# Patient Record
Sex: Female | Born: 1975 | Hispanic: Yes | Marital: Married | Smoking: Never smoker
Health system: Southern US, Community
[De-identification: ages and names within clinical notes are randomized; demographics above are authoritative.]

## PROBLEM LIST (undated history)

## (undated) DIAGNOSIS — O24419 Gestational diabetes mellitus in pregnancy, unspecified control: Secondary | ICD-10-CM

## (undated) DIAGNOSIS — F329 Major depressive disorder, single episode, unspecified: Secondary | ICD-10-CM

## (undated) DIAGNOSIS — F32A Depression, unspecified: Secondary | ICD-10-CM

## (undated) DIAGNOSIS — I8390 Asymptomatic varicose veins of unspecified lower extremity: Secondary | ICD-10-CM

## (undated) HISTORY — DX: Major depressive disorder, single episode, unspecified: F32.9

## (undated) HISTORY — DX: Gestational diabetes mellitus in pregnancy, unspecified control: O24.419

## (undated) HISTORY — DX: Depression, unspecified: F32.A

## (undated) HISTORY — DX: Asymptomatic varicose veins of unspecified lower extremity: I83.90

## (undated) HISTORY — PX: NO PAST SURGERIES: SHX2092

---

## 2001-07-06 ENCOUNTER — Ambulatory Visit (HOSPITAL_COMMUNITY): Admission: RE | Admit: 2001-07-06 | Discharge: 2001-07-06 | Payer: Self-pay | Admitting: *Deleted

## 2001-09-29 ENCOUNTER — Inpatient Hospital Stay (HOSPITAL_COMMUNITY): Admission: AD | Admit: 2001-09-29 | Discharge: 2001-10-01 | Payer: Self-pay | Admitting: *Deleted

## 2007-07-27 LAB — SICKLE CELL SCREEN: Sickle Cell Screen: NORMAL

## 2009-03-15 ENCOUNTER — Inpatient Hospital Stay (HOSPITAL_COMMUNITY): Admission: AD | Admit: 2009-03-15 | Discharge: 2009-03-15 | Payer: Self-pay | Admitting: Obstetrics and Gynecology

## 2009-03-15 ENCOUNTER — Encounter: Payer: Self-pay | Admitting: Obstetrics and Gynecology

## 2009-03-15 ENCOUNTER — Ambulatory Visit: Payer: Self-pay | Admitting: Obstetrics and Gynecology

## 2009-04-18 ENCOUNTER — Inpatient Hospital Stay (HOSPITAL_COMMUNITY): Admission: AD | Admit: 2009-04-18 | Discharge: 2009-04-18 | Payer: Self-pay | Admitting: Obstetrics & Gynecology

## 2010-04-22 LAB — OB RESULTS CONSOLE VARICELLA ZOSTER ANTIBODY, IGG: Varicella: IMMUNE

## 2010-04-24 ENCOUNTER — Ambulatory Visit (HOSPITAL_COMMUNITY): Admission: RE | Admit: 2010-04-24 | Discharge: 2010-04-24 | Payer: Self-pay | Admitting: Family Medicine

## 2010-07-28 ENCOUNTER — Ambulatory Visit (HOSPITAL_COMMUNITY): Admission: RE | Admit: 2010-07-28 | Discharge: 2010-07-28 | Payer: Self-pay | Admitting: Family Medicine

## 2010-08-25 ENCOUNTER — Ambulatory Visit (HOSPITAL_COMMUNITY): Admission: RE | Admit: 2010-08-25 | Discharge: 2010-08-25 | Payer: Self-pay | Admitting: Obstetrics & Gynecology

## 2010-08-29 ENCOUNTER — Encounter: Payer: Self-pay | Admitting: Obstetrics & Gynecology

## 2010-08-29 ENCOUNTER — Inpatient Hospital Stay (HOSPITAL_COMMUNITY)
Admission: AD | Admit: 2010-08-29 | Discharge: 2010-08-31 | Payer: Self-pay | Source: Home / Self Care | Admitting: Obstetrics & Gynecology

## 2010-08-29 ENCOUNTER — Ambulatory Visit: Payer: Self-pay | Admitting: Family Medicine

## 2011-01-20 LAB — RPR: RPR Ser Ql: NONREACTIVE

## 2011-01-20 LAB — CBC
Platelets: 208 10*3/uL (ref 150–400)
RDW: 17.1 % — ABNORMAL HIGH (ref 11.5–15.5)
WBC: 9.6 10*3/uL (ref 4.0–10.5)

## 2011-01-20 LAB — AMNISURE RUPTURE OF MEMBRANE (ROM) NOT AT ARMC: Amnisure ROM: POSITIVE

## 2011-02-15 LAB — POCT PREGNANCY, URINE: Preg Test, Ur: NEGATIVE

## 2011-02-15 LAB — GC/CHLAMYDIA PROBE AMP, GENITAL: GC Probe Amp, Genital: NEGATIVE

## 2011-02-16 LAB — CBC
HCT: 33.4 % — ABNORMAL LOW (ref 36.0–46.0)
MCV: 93.4 fL (ref 78.0–100.0)
RBC: 3.58 MIL/uL — ABNORMAL LOW (ref 3.87–5.11)
WBC: 11.9 10*3/uL — ABNORMAL HIGH (ref 4.0–10.5)

## 2012-05-24 IMAGING — US US OB FOLLOW-UP
1 series · 14 of 28 positions shown · non-contrast
Comparison: none

OBSTETRICAL ULTRASOUND:
 This ultrasound exam was performed in the [HOSPITAL] Ultrasound Department.  The OB US report was generated in the AS system, and faxed to the ordering physician.  This report is also available in [HOSPITAL]?s AccessANYware and in [REDACTED] PACS.

[Series 1: us ob follow up · 0.33mm/px · 14 of 30 slices shown]
[im 2/30]
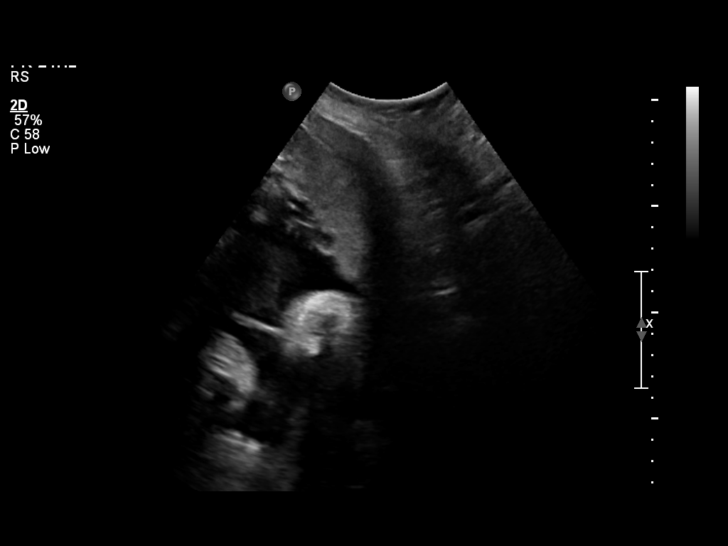
[im 4/30]
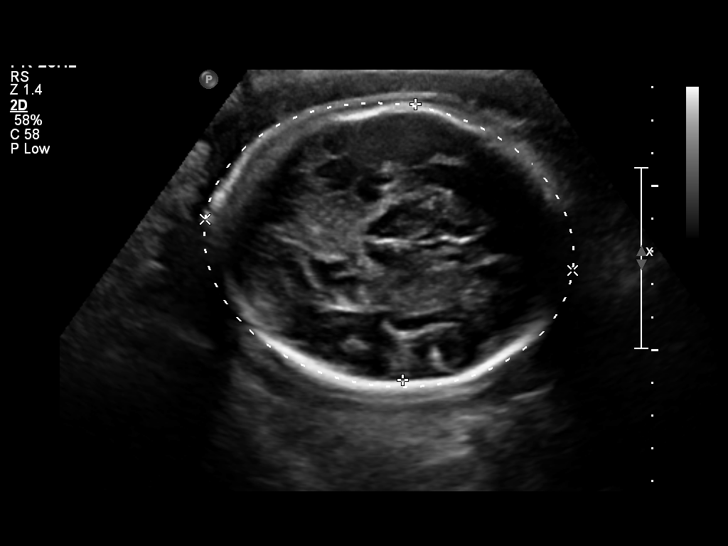
[im 6/30]
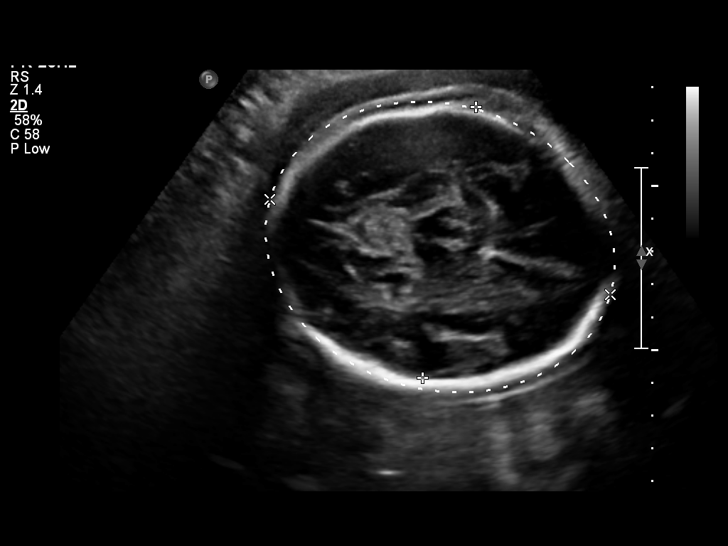
[im 8/30]
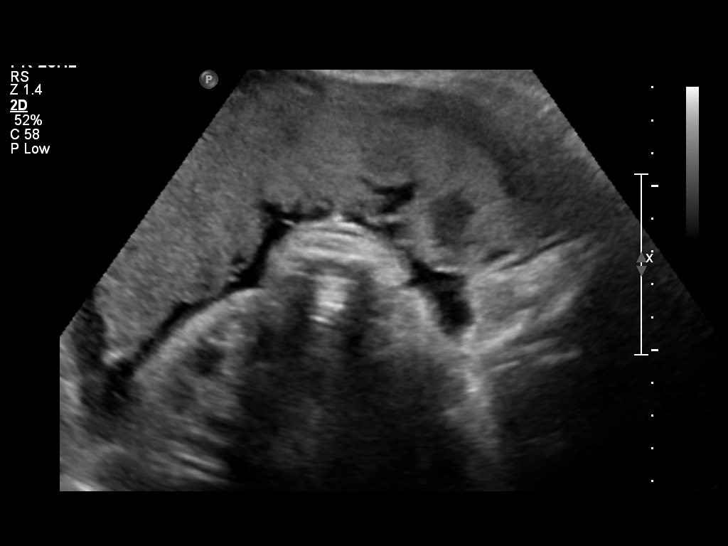
[im 10/30]
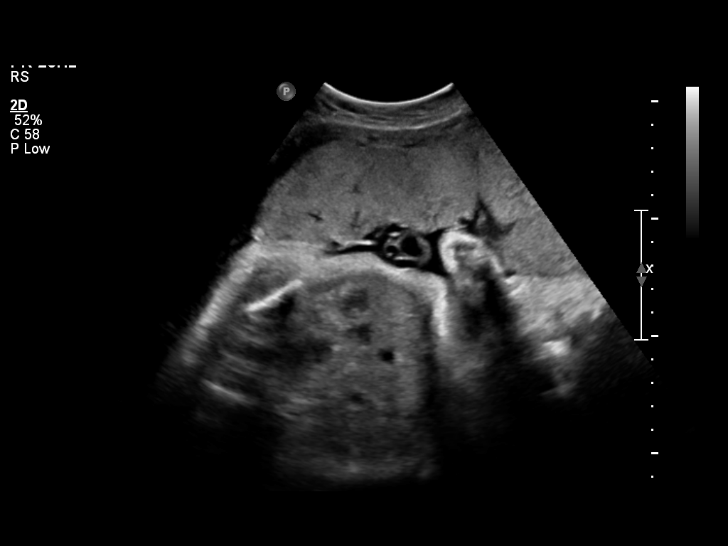
[im 12/30]
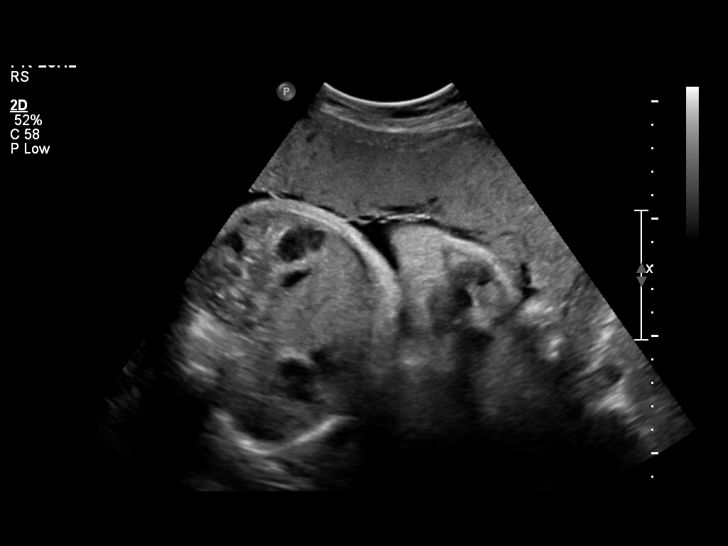
[im 14/30]
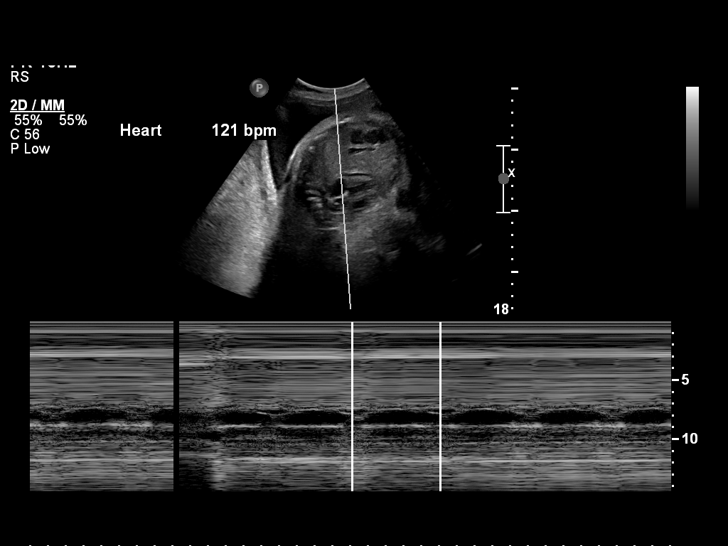
[im 17/30]
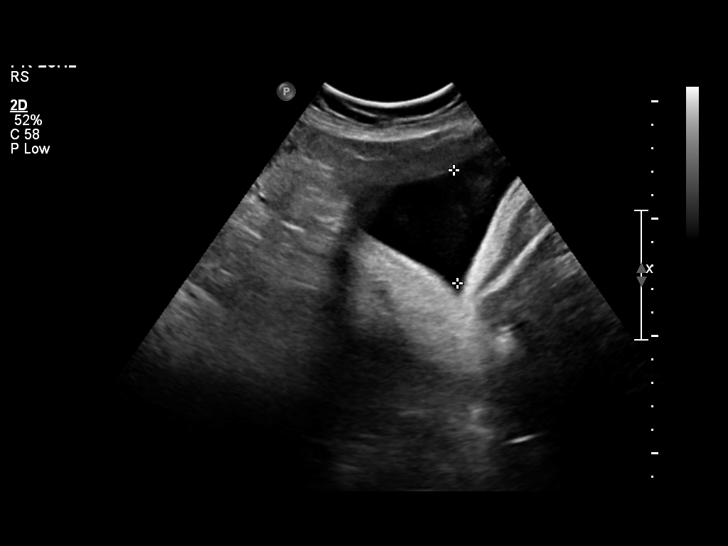
[im 19/30]
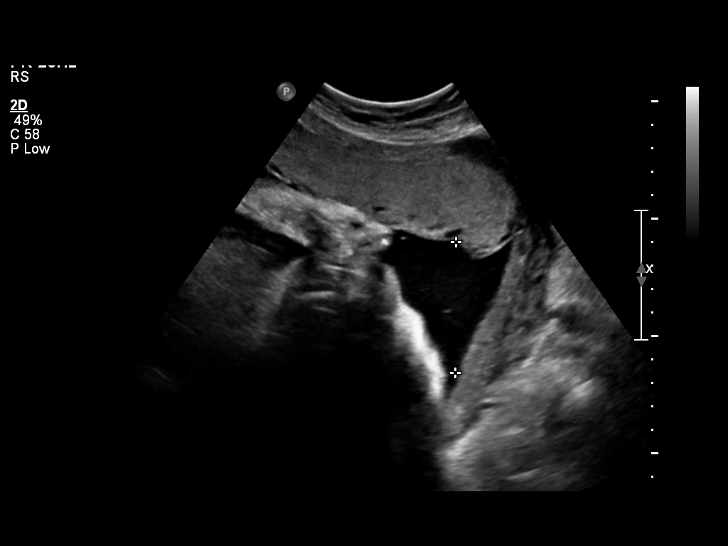
[im 21/30]
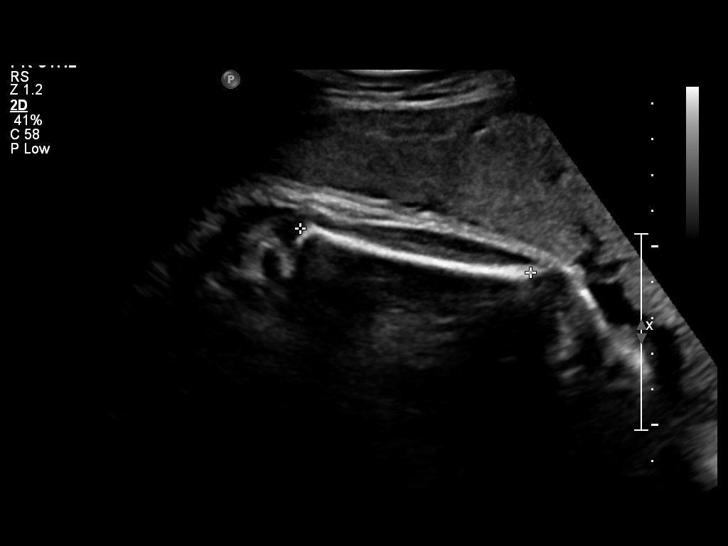
[im 23/30]
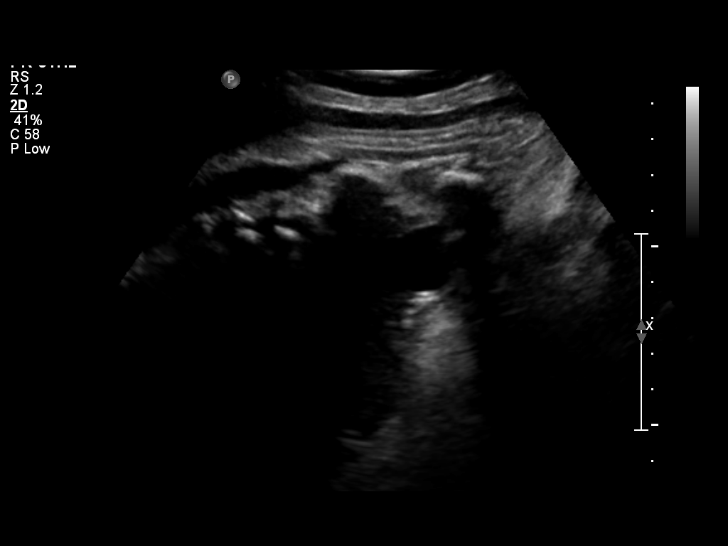
[im 25/30]
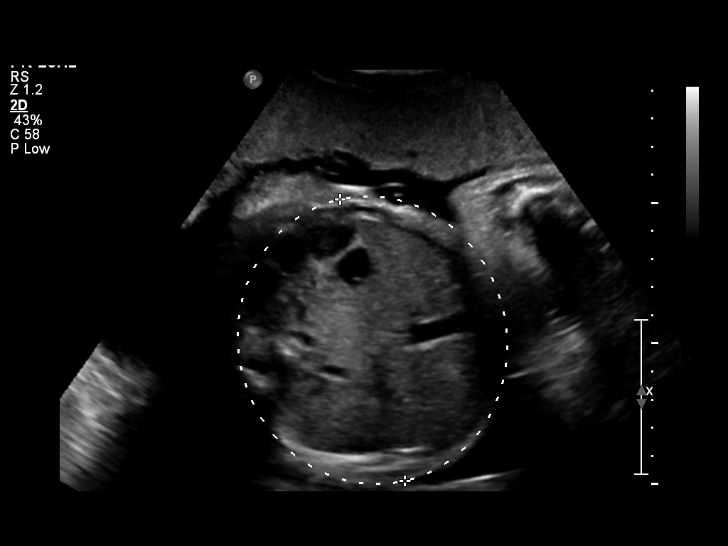
[im 27/30]
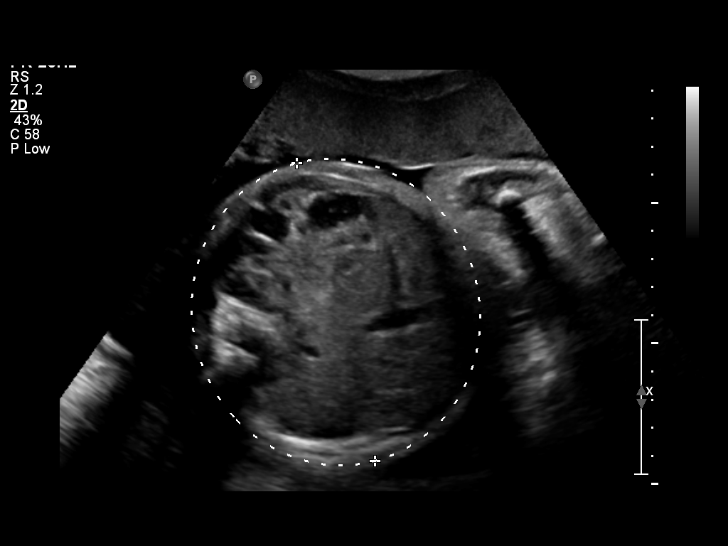
[im 30/30]
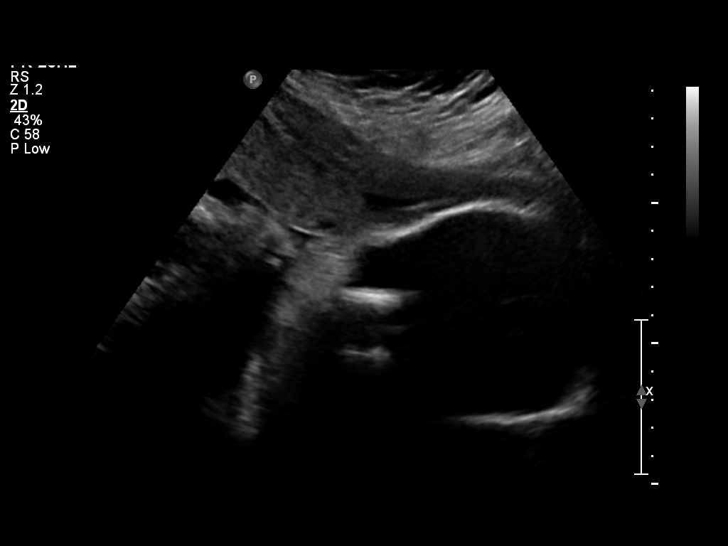

[14 of 28 positions shown; findings below may reference images not displayed]

IMPRESSION: See AS Obstetric US report.

## 2015-09-18 ENCOUNTER — Ambulatory Visit (INDEPENDENT_AMBULATORY_CARE_PROVIDER_SITE_OTHER): Payer: Self-pay | Admitting: Physician Assistant

## 2015-09-18 VITALS — BP 116/82 | HR 92 | Temp 97.8°F | Resp 18 | Ht 59.0 in | Wt 177.0 lb

## 2015-09-18 DIAGNOSIS — M7918 Myalgia, other site: Secondary | ICD-10-CM

## 2015-09-18 DIAGNOSIS — M791 Myalgia: Secondary | ICD-10-CM

## 2015-09-18 MED ORDER — CYCLOBENZAPRINE HCL 10 MG PO TABS: 5.0000 mg | ORAL_TABLET | Freq: Three times a day (TID) | ORAL | Status: DC | PRN

## 2015-09-18 MED ORDER — NAPROXEN 500 MG PO TABS: 500.0000 mg | ORAL_TABLET | Freq: Two times a day (BID) | ORAL | Status: AC

## 2015-09-20 NOTE — Progress Notes (Signed)
   09/20/2015 at 9:23 AM  Cheryl Haynes / DOB: 21-Jun-1976 / MRN: [id number]  The patient  does not have a problem list on file.  Hip Pain  The incident occurred 12 to 24 hours ago. The incident occurred at home. There was no injury mechanism. The pain is present in the right hip. The quality of the pain is described as cramping and aching. The pain is moderate. The pain has been fluctuating since onset. Pertinent negatives include no inability to bear weight, loss of motion, loss of sensation, muscle weakness, numbness or tingling. The symptoms are aggravated by movement. Cheryl Haynes has tried nothing for the symptoms.      Cheryl Haynes  has no past medical history on file.    Medications reviewed and updated by myself where necessary, and exist elsewhere in the encounter.   Cheryl Haynes has No Known Allergies. Cheryl Haynes   Cheryl Haynes  has no sexual activity history on file. The patient  has no past surgical history on file.  Her family history includes Diabetes in her father.  Review of Systems  Constitutional: Negative for fever.  Gastrointestinal: Negative for nausea.  Genitourinary: Negative for dysuria, urgency and frequency.  Musculoskeletal: Positive for myalgias.  Skin: Negative for rash.  Neurological: Negative for tingling, numbness and headaches.    OBJECTIVE  Her  height is 4\' 11"  (1.499 m) and weight is 177 lb (80.287 kg). Her oral temperature is 97.8 F (36.6 C). Her blood pressure is 116/82 and her pulse is 92. Her respiration is 18 and oxygen saturation is 98%.  The patient's body mass index is 35.73 kg/(m^2).  Physical Exam  Vitals reviewed. Constitutional: Cheryl Haynes is oriented to person, place, and time.  Respiratory: Effort normal.  Musculoskeletal:       Lumbar back: Normal.       Back:  Neurological: Cheryl Haynes is alert and oriented to person, place, and time.    No results found for this or any previous visit (from the past 24 hour(s)).  ASSESSMENT & PLAN  Ambyr was seen  today for hip pain.  Diagnoses and all orders for this visit:  Piriformis muscle pain: Likely spasm.  No injury mechanism and no worrisome signs.  Will treat symptomatically.  Advised Cheryl Haynes return for imaging in two weeks if her pain is not improved.   -     cyclobenzaprine (FLEXERIL) 10 MG tablet; Take 0.5-1 tablets (5-10 mg total) by mouth 3 (three) times daily as needed for muscle spasms (May cause drowsiness. Do no operate heavy machinery while taking.). -     naproxen (NAPROSYN) 500 MG tablet; Take 1 tablet (500 mg total) by mouth 2 (two) times daily with a meal. Do not take ibuprofen, aleve, or aspirin with this medication.       The patient was advised to call or come back to clinic if Cheryl Haynes does not see an improvement in symptoms, or worsens with the above plan.   Deliah BostonMichael Gelene Recktenwald, MHS, PA-C [facility] 09/20/2015 9:23 AM

## 2015-11-09 NOTE — L&D Delivery Note (Signed)
40 y.o. [id number] at [id number] delivered a viable female infant in cephalic en caul, LOA position. No nuchal cord. Right anterior shoulder delivered with ease. 60 sec delayed cord clamping. Cord clamped x2 and cut. Placenta delivered spontaneously intact, with 3VC. Fundus firm on exam with massage and pitocin. Good hemostasis noted.  Laceration: 1st degree perineal Suture: 3-0 Vicryl Good hemostasis noted.  Mom and baby recovering in LDR.    Apgars: 8/9 Weight: pending skin to skin EBL: 150 cc   Jen MowElizabeth Mumaw, DO [facility] Fellow [facility] 10/21/2016, 12:10 AM

## 2016-03-18 ENCOUNTER — Ambulatory Visit (INDEPENDENT_AMBULATORY_CARE_PROVIDER_SITE_OTHER): Payer: Self-pay | Admitting: Family Medicine

## 2016-03-18 VITALS — BP 120/74 | HR 86 | Temp 98.8°F | Resp 18 | Ht <= 58 in | Wt 176.4 lb

## 2016-03-18 DIAGNOSIS — R519 Headache, unspecified: Secondary | ICD-10-CM

## 2016-03-18 DIAGNOSIS — N912 Amenorrhea, unspecified: Secondary | ICD-10-CM

## 2016-03-18 DIAGNOSIS — R51 Headache: Secondary | ICD-10-CM

## 2016-03-18 DIAGNOSIS — K219 Gastro-esophageal reflux disease without esophagitis: Secondary | ICD-10-CM

## 2016-03-18 DIAGNOSIS — R1013 Epigastric pain: Secondary | ICD-10-CM

## 2016-03-18 LAB — POC MICROSCOPIC URINALYSIS (UMFC): MUCUS RE: ABSENT

## 2016-03-18 LAB — POCT CBC
GRANULOCYTE PERCENT: 72.3 % (ref 37–80)
HEMATOCRIT: 35.3 % — AB (ref 37.7–47.9)
Hemoglobin: 12.6 g/dL (ref 12.2–16.2)
LYMPH, POC: 2.1 (ref 0.6–3.4)
MCH, POC: 31.9 pg — AB (ref 27–31.2)
MCHC: 35.7 g/dL — AB (ref 31.8–35.4)
MCV: 89.2 fL (ref 80–97)
MID (CBC): 0.6 (ref 0–0.9)
MPV: 7.8 fL (ref 0–99.8)
POC GRANULOCYTE: 7 — AB (ref 2–6.9)
POC LYMPH %: 21.8 % (ref 10–50)
POC MID %: 5.9 %M (ref 0–12)
Platelet Count, POC: 212 10*3/uL (ref 142–424)
RBC: 3.96 M/uL — AB (ref 4.04–5.48)
RDW, POC: 13.3 %
WBC: 9.7 10*3/uL (ref 4.6–10.2)

## 2016-03-18 LAB — POCT URINALYSIS DIP (MANUAL ENTRY)
BILIRUBIN UA: NEGATIVE
BILIRUBIN UA: NEGATIVE
GLUCOSE UA: NEGATIVE
Nitrite, UA: NEGATIVE
Protein Ur, POC: NEGATIVE
Urobilinogen, UA: 0.2
pH, UA: 6

## 2016-03-18 LAB — GLUCOSE, POCT (MANUAL RESULT ENTRY): POC GLUCOSE: 107 mg/dL — AB (ref 70–99)

## 2016-03-18 LAB — POCT URINE PREGNANCY: PREG TEST UR: POSITIVE — AB

## 2016-03-18 NOTE — Patient Instructions (Addendum)
1.  Zantac/ranitidine 150mg  --- 1 tablet twice daily for stomach pain 2.  Start Prenatal vitamin --- 1 tablet daily. 3.  Tylenol - 2 tablets four times daily as needed.      Primer trimestre de Psychiatrist (First Trimester of Pregnancy) El primer trimestre de Psychiatrist se extiende desde la semana1 hasta el final de la semana12 (mes1 al mes3). Una semana despus de que un espermatozoide fecunda un vulo, este se implantar en la pared uterina. Este embrin comenzar a Camera operator convertirse en un beb. Sus genes y los de su pareja forman el beb. Los genes del varn determinan si ser un nio o una nia. Entre la semana6 y [place], se forman los ojos y [place], y los latidos del corazn pueden verse en la ecografa. Al final de las 12semanas, todos los rganos del beb estn formados.  Ahora que est embarazada, querr hacer todo lo que est a su alcance para tener un beb sano. Dos de las cosas ms importantes son Winferd Humphrey buena atencin prenatal y seguir las indicaciones del mdico. La atencin prenatal incluye toda la asistencia mdica que usted recibe antes del nacimiento del beb. Esta ayudar a prevenir, Engineer, manufacturing y tratar cualquier problema durante el embarazo y [place]. CAMBIOS EN EL ORGANISMO Su organismo atraviesa por muchos cambios durante el [place], y estos varan de Neomia Dear mujer a Educational psychologist.   Al principio, puede aumentar o bajar algunos kilos.  Puede tener Programme researcher, broadcasting/film/video (nuseas) y vomitar. Si no puede controlar los vmitos, llame al mdico.  Puede cansarse con facilidad.  Es posible que tenga dolores de cabeza que pueden aliviarse con los medicamentos que el mdico le permita tomar.  Puede orinar con mayor frecuencia. El dolor al orinar puede significar que usted tiene una infeccin de la vejiga.  Debido al Vanetta Mulders, puede tener acidez estomacal.  Puede estar estreida, ya que ciertas hormonas enlentecen los movimientos de los msculos que [employer] desechos a  travs de los intestinos.  Pueden aparecer hemorroides o abultarse e hincharse las venas (venas varicosas).  Las [employer]ra Foods pueden empezar a Government social research officer y Emergency planning/management officer. Los pezones pueden sobresalir ms, y el tejido que los rodea (areola) tornarse ms oscuro.  Las Veterinary surgeon y estar sensibles al cepillado y al hilo dental.  Pueden aparecer zonas oscuras o manchas (cloasma, mscara del Psychiatrist) en el rostro que probablemente se atenuarn despus del nacimiento del beb.  Los perodos menstruales se interrumpirn.  Tal vez no tenga apetito.  Puede sentir un fuerte deseo de consumir ciertos alimentos.  Puede tener cambios a Theatre manager a da, por ejemplo, por momentos puede estar emocionada por el Psychiatrist y por otros preocuparse porque algo pueda salir mal con el embarazo o el beb.  Tendr sueos ms vvidos y extraos.  Tal vez haya cambios en el cabello que pueden incluir su engrosamiento, crecimiento rpido y cambios en la textura. A algunas mujeres tambin se les cae el cabello durante o despus del [place], o tienen el cabello seco o fino. Lo ms probable es que el cabello se le normalice despus del nacimiento del beb. QU DEBE ESPERAR EN LAS CONSULTAS PRENATALES Durante una visita prenatal de rutina:  La pesarn para asegurarse de que usted y el beb estn creciendo normalmente.  Le controlarn la presin arterial.  Le medirn el abdomen para controlar el desarrollo del beb.  Se escucharn los latidos cardacos a partir de la semana10 o la12 de embarazo, aproximadamente.  Se analizarn los [place] de  los estudios solicitados en visitas anteriores. El mdico puede preguntarle:  Cmo se siente.  Si siente los movimientos del beb.  Si ha tenido sntomas anormales, como prdida de lquido, [place], dolores de cabeza intensos o clicos abdominales.  Si est consumiendo algn producto que contenga tabaco, como cigarrillos, tabaco de Theatre manager y  Administrator, Civil Service.  Si tiene [employer]. Otros estudios que pueden realizarse durante el primer trimestre incluyen lo siguiente:  Anlisis de sangre para determinar el tipo de sangre y Engineer, manufacturing la presencia de infecciones previas. Adems, se los usar para controlar si los niveles de hierro son bajos (anemia) y Chief Strategy Officer los anticuerpos Rh. En una etapa ms avanzada del [place], se harn anlisis de sangre para saber si tiene diabetes, junto con otros estudios si surgen problemas.  Anlisis de orina para detectar infecciones, diabetes o protenas en la orina.  Una ecografa para confirmar que el beb crece y se desarrolla correctamente.  Una amniocentesis para diagnosticar posibles problemas genticos.  Estudios del feto para descartar espina bfida y sndrome de [place].  Es posible que necesite otras pruebas adicionales.  Prueba del VIH (virus de inmunodeficiencia humana). Los exmenes prenatales de rutina incluyen la prueba de deteccin del VIH, a menos que decida no Futures trader. INSTRUCCIONES PARA EL CUIDADO EN EL HOGAR  Medicamentos:  Siga las indicaciones del mdico en relacin con el uso de medicamentos. Durante el embarazo, hay medicamentos que pueden tomarse y otros que no.  Tome las vitaminas prenatales como se le indic.  Si est estreida, tome un laxante suave, si el mdico lo [place]. Dieta  Consuma alimentos balanceados. Elija alimentos variados, como carne o protenas de origen vegetal, pescado, leche y productos lcteos descremados, verduras, frutas y panes y Radiation protection practitioner. El mdico la ayudar a Production assistant, radio cantidad de peso que puede [place].  No coma carne cruda ni quesos sin cocinar. Estos elementos contienen bacterias que pueden causar defectos congnitos en el beb.  La ingesta diaria de cuatro o cinco comidas pequeas en lugar de tres comidas abundantes puede ayudar a Yahoo nuseas y los vmitos. Si empieza a tener nuseas, comer  algunas [address] puede ser de [place]. Beber lquidos [place] comidas en lugar de tomarlos durante las comidas tambin puede ayudar a Optician, dispensing las nuseas y los vmitos.  Si est estreida, consuma alimentos con alto contenido de [place], como verduras y frutas frescas, y Radiation protection practitioner. Beba suficiente lquido para Photographer orina clara o de color amarillo plido. Actividad y Landscape architect ejercicio solamente como se lo haya indicado el mdico. El ejercicio la ayudar a:  Art gallery manager.  Mantenerse en forma.  Estar preparada para el trabajo de parto y [place].  Los dolores, los clicos en la parte baja del abdomen o los calambres en la cintura son un buen indicio de que debe dejar de Corporate treasurer. Consulte al mdico antes de seguir haciendo ejercicios normales.  Intente no estar de pie FedEx. Mueva las piernas con frecuencia si debe estar de pie en un lugar durante mucho tiempo.  Evite levantar pesos [employer] Brands.  Use zapatos de tacones bajos y [place].  Puede seguir teniendo [employer] [place] [employer], excepto que el mdico le indique lo contrario. Alivio del dolor o las molestias  Use un sostn que le brinde buen soporte si siente dolor a la palpacin Mattel.  Dese baos de asiento con agua tibia para Engineer, materials o las molestias causadas por las hemorroides. Use crema  antihemorroidal si el mdico se lo permite.  Descanse con las piernas elevadas si tiene calambres o dolor de cintura.  Si tiene venas varicosas en las piernas, use medias de descanso. Eleve los pies durante 15minutos, 3 o 4veces por da. Limite la cantidad de sal en su dieta. Cuidados prenatales  Programe las visitas prenatales para la semana12 de [place]azo. Generalmente se programan cada mes al principio y se hacen ms frecuentes en los 2 ltimos meses antes del parto.  Escriba sus preguntas. Llvelas cuando concurra a las visitas prenatales.  Concurra  a todas las visitas prenatales como se lo haya indicado el mdico. Seguridad  Colquese el cinturn de seguridad cuando conduzca.  Haga una lista de los nmeros de telfono de Associate Professoremergencia, que W. R. Berkleyincluya los nmeros de telfono de familiares, Redrockamigos, el hospital y los departamentos de polica y bomberos. Consejos generales  Pdale al mdico que la derive a clases de educacin prenatal en su localidad. Debe comenzar a tomar las clases antes de Cytogeneticistentrar en el mes6 de embarazo.  Pida ayuda si tiene necesidades nutricionales o de asesoramiento Academic librariandurante el embarazo. El mdico puede aconsejarla o derivarla a especialistas para que la ayuden con diferentes necesidades.  No se d baos de inmersin en agua caliente, baos turcos ni saunas.  No se haga duchas vaginales ni use tampones o toallas higinicas perfumadas.  No mantenga las piernas cruzadas durante [place] tiempo.  Evite el contacto con las bandejas sanitarias de los gatos y la tierra que estos animales usan. Estos elementos contienen bacterias que pueden causar defectos congnitos al beb y la posible prdida del feto debido a un aborto espontneo o muerte fetal.  No fume, no consuma hierbas ni medicamentos que no hayan sido recetados por el mdico. Las sustancias qumicas que estos productos contienen afectan la formacin y el desarrollo del beb.  No consuma ningn producto que contenga tabaco, lo que incluye cigarrillos, tabaco de Theatre managermascar y Administrator, Civil Servicecigarrillos electrnicos. Si necesita ayuda para dejar de fumar, consulte al American Expressmdico. Puede recibir asesoramiento y otro tipo de recursos para dejar de fumar.  Programe una cita con el dentista. En su casa, lvese los dientes con un cepillo dental blando y psese el hilo dental con suavidad. SOLICITE ATENCIN MDICA SI:   Tiene mareos.  Siente clicos leves, presin en la pelvis o dolor persistente en el abdomen.  Tiene nuseas, vmitos o diarrea persistentes.  Tiene secrecin vaginal con mal  olor.  Siente dolor al ConocoPhillipsorinar.  Tiene el rostro, las Atkinsmanos, las piernas o los tobillos ms hinchados. SOLICITE ATENCIN MDICA DE INMEDIATO SI:   Tiene fiebre.  Tiene una prdida de lquido por la vagina.  Tiene sangrado o pequeas prdidas vaginales.  Siente dolor intenso o clicos en el abdomen.  Sube o baja de peso rpidamente.  Vomita sangre de color rojo brillante o material que parezca granos de caf.  Ha estado expuesta a la rubola y no ha sufrido la enfermedad.  Ha estado expuesta a la quinta enfermedad o a la varicela.  Tiene un dolor de cabeza intenso.  Le falta el aire.  Sufre cualquier tipo de traumatismo, por ejemplo, debido a una cada o un accidente automovilstico.   Esta informacin no tiene Theme park managercomo fin reemplazar el consejo del mdico. Asegrese de hacerle al mdico cualquier pregunta que tenga.   Document Released: 08/04/2005 Document Revised: 11/15/2014 Elsevier Interactive Patient Education Yahoo[employer] Inc2016 Elsevier Inc.      IF you received an x-ray today, you will receive an invoice from [facility].  Please contact [facility] at [phone] with questions or concerns regarding your invoice.   IF you received labwork today, you will receive an invoice from [facility]. Please contact [facility] at [phone] with questions or concerns regarding your invoice.   Our billing staff will not be able to assist you with questions regarding bills from these companies.  You will be contacted with the lab results as soon as they are available. The fastest way to get your results is to activate your My Chart account. Instructions are located on the last page of this paperwork. If you have not heard from us regarding the results in 2 weeks, please contact this office.

## 2016-03-18 NOTE — Progress Notes (Signed)
Subjective:    Patient ID: Cheryl Haynes, female    DOB: 08-Jun-1976, 40 y.o.   MRN: [id number]  03/18/2016  Headache; Burning sensation in stomach; and Possible Pregnancy   HPI This 40 y.o. female presents for evaluation of headache, abdominal pain.  Concern for pregnancy.  Last menses 01-31-2016.  No fever; +chills.  Onset last week one week ago.  +HA started one week ago as well.  No ear pain; no sore throat. No rhinorrhea; no congestion. No cough.  +nausea; +vomiting in morning.  No diarrhea.  Burning in upper epigastric pain.  No dysuria.  No vaginal discharge.  Frequent belching.  Normal bowel movements.  No PNV.    Review of Systems  Constitutional: Positive for chills. Negative for fever, diaphoresis and fatigue.  HENT: Negative for congestion, ear pain, rhinorrhea and sore throat.   Eyes: Negative for visual disturbance.  Respiratory: Negative for cough and shortness of breath.   Cardiovascular: Negative for chest pain, palpitations and leg swelling.  Gastrointestinal: Positive for nausea, vomiting and abdominal pain. Negative for diarrhea and constipation.  Endocrine: Negative for cold intolerance, heat intolerance, polydipsia, polyphagia and polyuria.  Genitourinary: Positive for menstrual problem. Negative for dysuria, urgency, frequency, hematuria, flank pain, decreased urine volume, vaginal bleeding, vaginal discharge, genital sores, vaginal pain and pelvic pain.  Neurological: Positive for headaches. Negative for dizziness, tremors, seizures, syncope, facial asymmetry, speech difficulty, weakness, light-headedness and numbness.    History reviewed. No pertinent past medical history. History reviewed. No pertinent past surgical history. No Known Allergies No current outpatient prescriptions on file.   No current facility-administered medications for this visit.   Social History   Social History  . Marital Status: Married    Spouse Name: N/A  . Number of  Children: N/A  . Years of Education: N/A   Occupational History  . Not on file.   Social History Main Topics  . Smoking status: Never Smoker   . Smokeless tobacco: Not on file  . Alcohol Use: Not on file  . Drug Use: Not on file  . Sexual Activity: Not on file   Other Topics Concern  . Not on file   Social History Narrative   Family History  Problem Relation Age of Onset  . Diabetes Father        Objective:    BP 120/74 mmHg  Pulse 86  Temp(Src) 98.8 F (37.1 C) (Oral)  Resp 18  Ht 4' 9.5" (1.461 m)  Wt 176 lb 6.4 oz (80.015 kg)  BMI 37.49 kg/m2  SpO2 99%  LMP 01/31/2016 Physical Exam  Constitutional: She is oriented to person, place, and time. She appears well-developed and well-nourished. No distress.  HENT:  Head: Normocephalic and atraumatic.  Right Ear: External ear normal.  Left Ear: External ear normal.  Nose: Nose normal.  Mouth/Throat: Oropharynx is clear and moist.  Eyes: Conjunctivae are normal. Pupils are equal, round, and reactive to light.  Neck: Normal range of motion. Neck supple. No thyromegaly present.  Cardiovascular: Normal rate, regular rhythm and normal heart sounds.  Exam reveals no gallop and no friction rub.   No murmur heard. Pulmonary/Chest: Effort normal and breath sounds normal. She has no wheezes. She has no rales.  Abdominal: Soft. Bowel sounds are normal. She exhibits no distension and no mass. There is tenderness in the epigastric area. There is no rebound and no guarding.  Lymphadenopathy:    She has no cervical adenopathy.  Neurological: She is alert and oriented to  person, place, and time.  Skin: She is not diaphoretic.  Psychiatric: She has a normal mood and affect. Her behavior is normal.  Nursing note and vitals reviewed.  Results for orders placed or performed in visit on 03/18/16  POCT urinalysis dipstick  Result Value Ref Range   Color, UA yellow yellow   Clarity, UA hazy (A) clear   Glucose, UA negative negative     Bilirubin, UA negative negative   Ketones, POC UA negative negative   Spec Grav, UA <=1.005    Blood, UA trace-lysed (A) negative   pH, UA 6.0    Protein Ur, POC negative negative   Urobilinogen, UA 0.2    Nitrite, UA Negative Negative   Leukocytes, UA small (1+) (A) Negative  POCT urine pregnancy  Result Value Ref Range   Preg Test, Ur Positive (A) Negative  POCT Microscopic Urinalysis ([facility])  Result Value Ref Range   WBC,UR,HPF,POC Few (A) None WBC/hpf   RBC,UR,HPF,POC None None RBC/hpf   Bacteria Many (A) None, Too numerous to count   Mucus Absent Absent   Epithelial Cells, UR Per Microscopy Moderate (A) None, Too numerous to count cells/hpf  POCT CBC  Result Value Ref Range   WBC 9.7 4.6 - 10.2 K/uL   Lymph, poc 2.1 0.6 - 3.4   POC LYMPH PERCENT 21.8 10 - 50 %L   MID (cbc) 0.6 0 - 0.9   POC MID % 5.9 0 - 12 %M   POC Granulocyte 7.0 (A) 2 - 6.9   Granulocyte percent 72.3 37 - 80 %G   RBC 3.96 (A) 4.04 - 5.48 M/uL   Hemoglobin 12.6 12.2 - 16.2 g/dL   HCT, POC 09.8 (A) 11.9 - 47.9 %   MCV 89.2 80 - 97 fL   MCH, POC 31.9 (A) 27 - 31.2 pg   MCHC 35.7 (A) 31.8 - 35.4 g/dL   RDW, POC 14.7 %   Platelet Count, POC 212 142 - 424 K/uL   MPV 7.8 0 - 99.8 fL  POCT glucose (manual entry)  Result Value Ref Range   POC Glucose 107 (A) 70 - 99 mg/dl       Assessment & Plan:   1. Amenorrhea   2. Acute nonintractable headache, unspecified headache type   3. Abdominal pain, epigastric   4. Gastroesophageal reflux disease without esophagitis    -New. -+positive urine HCG; recommend establishing with local OG/GYN -recommend Zantac  bid for GERD symptoms. -BRAT diet, frequent small meals.  -PNV daily. -Tylenol only for headaches.   Orders Placed This Encounter  Procedures  . POCT urinalysis dipstick  . POCT urine pregnancy  . POCT Microscopic Urinalysis ([facility])  . POCT CBC  . POCT glucose (manual entry)   No orders of the defined types were placed in this  encounter.    No Follow-up on file.    Real Cona Paulita Fujita, M.D. [facility] [address] [place], [place]  [address] [phone] phone [phone] fax

## 2016-05-06 ENCOUNTER — Other Ambulatory Visit (HOSPITAL_COMMUNITY): Payer: Self-pay | Admitting: Nurse Practitioner

## 2016-05-06 DIAGNOSIS — Z3689 Encounter for other specified antenatal screening: Secondary | ICD-10-CM

## 2016-05-06 DIAGNOSIS — O09522 Supervision of elderly multigravida, second trimester: Secondary | ICD-10-CM

## 2016-05-06 DIAGNOSIS — Z3A18 18 weeks gestation of pregnancy: Secondary | ICD-10-CM

## 2016-05-06 LAB — OB RESULTS CONSOLE HGB/HCT, BLOOD
HCT: 31 %
HEMATOCRIT: 31 %
HEMATOCRIT: 31 %
HEMOGLOBIN: 10.7 g/dL
HEMOGLOBIN: 10.7 g/dL
HEMOGLOBIN: 10.7 g/dL

## 2016-05-06 LAB — GLUCOSE TOLERANCE, 1 HOUR (50G) W/O FASTING: Glucose, GTT - 1 Hour: 155 mg/dL (ref ?–200)

## 2016-05-06 LAB — OB RESULTS CONSOLE GC/CHLAMYDIA
Chlamydia: NEGATIVE
Chlamydia: NEGATIVE
GC PROBE AMP, GENITAL: NEGATIVE
GC PROBE AMP, GENITAL: NEGATIVE

## 2016-05-06 LAB — OB RESULTS CONSOLE ABO/RH: RH TYPE: POSITIVE

## 2016-05-06 LAB — OB RESULTS CONSOLE ANTIBODY SCREEN: Antibody Screen: NEGATIVE

## 2016-05-06 LAB — OB RESULTS CONSOLE RPR
RPR: NONREACTIVE
RPR: NONREACTIVE
RPR: NONREACTIVE

## 2016-05-06 LAB — OB RESULTS CONSOLE PLATELET COUNT
Platelets: 198 10*3/uL
Platelets: 198 10*3/uL
Platelets: 198 10*3/uL

## 2016-05-06 LAB — OB RESULTS CONSOLE HIV ANTIBODY (ROUTINE TESTING)
HIV: NONREACTIVE
HIV: NONREACTIVE

## 2016-05-06 LAB — OB RESULTS CONSOLE RUBELLA ANTIBODY, IGM: RUBELLA: IMMUNE

## 2016-05-06 LAB — OB RESULTS CONSOLE HEPATITIS B SURFACE ANTIGEN: HEP B S AG: NEGATIVE

## 2016-05-06 LAB — CYSTIC FIBROSIS DIAGNOSTIC STUDY: INTERPRETATION-CFDNA: NEGATIVE

## 2016-05-27 ENCOUNTER — Encounter (HOSPITAL_COMMUNITY): Payer: Self-pay | Admitting: Nurse Practitioner

## 2016-05-28 ENCOUNTER — Encounter: Payer: Self-pay | Admitting: *Deleted

## 2016-05-31 ENCOUNTER — Encounter: Payer: Self-pay | Admitting: Obstetrics and Gynecology

## 2016-05-31 ENCOUNTER — Ambulatory Visit (INDEPENDENT_AMBULATORY_CARE_PROVIDER_SITE_OTHER): Payer: Self-pay | Admitting: Obstetrics and Gynecology

## 2016-05-31 ENCOUNTER — Encounter: Payer: Self-pay | Attending: Obstetrics and Gynecology | Admitting: Dietician

## 2016-05-31 DIAGNOSIS — O09899 Supervision of other high risk pregnancies, unspecified trimester: Secondary | ICD-10-CM | POA: Insufficient documentation

## 2016-05-31 DIAGNOSIS — E669 Obesity, unspecified: Secondary | ICD-10-CM

## 2016-05-31 DIAGNOSIS — Z713 Dietary counseling and surveillance: Secondary | ICD-10-CM | POA: Insufficient documentation

## 2016-05-31 DIAGNOSIS — O0992 Supervision of high risk pregnancy, unspecified, second trimester: Secondary | ICD-10-CM

## 2016-05-31 DIAGNOSIS — O24419 Gestational diabetes mellitus in pregnancy, unspecified control: Secondary | ICD-10-CM | POA: Insufficient documentation

## 2016-05-31 DIAGNOSIS — O99210 Obesity complicating pregnancy, unspecified trimester: Secondary | ICD-10-CM | POA: Insufficient documentation

## 2016-05-31 DIAGNOSIS — O24410 Gestational diabetes mellitus in pregnancy, diet controlled: Secondary | ICD-10-CM

## 2016-05-31 DIAGNOSIS — O09892 Supervision of other high risk pregnancies, second trimester: Secondary | ICD-10-CM

## 2016-05-31 DIAGNOSIS — O09219 Supervision of pregnancy with history of pre-term labor, unspecified trimester: Secondary | ICD-10-CM

## 2016-05-31 DIAGNOSIS — O99212 Obesity complicating pregnancy, second trimester: Secondary | ICD-10-CM

## 2016-05-31 DIAGNOSIS — O09522 Supervision of elderly multigravida, second trimester: Secondary | ICD-10-CM

## 2016-05-31 DIAGNOSIS — O0990 Supervision of high risk pregnancy, unspecified, unspecified trimester: Secondary | ICD-10-CM | POA: Insufficient documentation

## 2016-05-31 DIAGNOSIS — O09212 Supervision of pregnancy with history of pre-term labor, second trimester: Secondary | ICD-10-CM

## 2016-05-31 DIAGNOSIS — O09529 Supervision of elderly multigravida, unspecified trimester: Secondary | ICD-10-CM | POA: Insufficient documentation

## 2016-05-31 HISTORY — DX: Gestational diabetes mellitus in pregnancy, unspecified control: O24.419

## 2016-05-31 LAB — COMPREHENSIVE METABOLIC PANEL
ALBUMIN: 3.8 g/dL (ref 3.6–5.1)
ALT: 13 U/L (ref 6–29)
AST: 29 U/L (ref 10–30)
Alkaline Phosphatase: 45 U/L (ref 33–115)
BILIRUBIN TOTAL: 0.6 mg/dL (ref 0.2–1.2)
BUN: 7 mg/dL (ref 7–25)
CALCIUM: 8.5 mg/dL — AB (ref 8.6–10.2)
CHLORIDE: 104 mmol/L (ref 98–110)
CO2: 19 mmol/L — AB (ref 20–31)
Creat: 0.34 mg/dL — ABNORMAL LOW (ref 0.50–1.10)
Glucose, Bld: 91 mg/dL (ref 65–99)
POTASSIUM: 4 mmol/L (ref 3.5–5.3)
Sodium: 135 mmol/L (ref 135–146)
Total Protein: 6.7 g/dL (ref 6.1–8.1)

## 2016-05-31 LAB — CBC
HEMATOCRIT: 32.8 % — AB (ref 35.0–45.0)
HEMOGLOBIN: 11.1 g/dL — AB (ref 11.7–15.5)
MCH: 31.2 pg (ref 27.0–33.0)
MCHC: 33.8 g/dL (ref 32.0–36.0)
MCV: 92.1 fL (ref 80.0–100.0)
MPV: 11 fL (ref 7.5–12.5)
Platelets: 244 10*3/uL (ref 140–400)
RBC: 3.56 MIL/uL — ABNORMAL LOW (ref 3.80–5.10)
RDW: 13.3 % (ref 11.0–15.0)
WBC: 8.6 10*3/uL (ref 3.8–10.8)

## 2016-05-31 LAB — TSH: TSH: 1.53 mIU/L

## 2016-05-31 LAB — HEMOGLOBIN A1C
Hgb A1c MFr Bld: 5.2 % (ref <5.7)
MEAN PLASMA GLUCOSE: 103 mg/dL

## 2016-05-31 MED ORDER — ASPIRIN EC 81 MG PO TBEC: 81.0000 mg | DELAYED_RELEASE_TABLET | Freq: Every day | ORAL | 6 refills | Status: DC

## 2016-05-31 NOTE — Patient Instructions (Signed)
Diabetes mellitus gestacional (Gestational Diabetes Mellitus) La diabetes mellitus gestacional, ms comnmente conocida como diabetes gestacional es un tipo de diabetes que desarrollan algunas mujeres durante el embarazo. En la diabetes gestacional, el pncreas no produce suficiente insulina (una hormona) o las clulas son menos sensibles a la insulina producida (resistencia a la insulina), o ambas cosas. Normalmente, la insulina mueve los azcares de los alimentos a las clulas de los tejidos. Las clulas de los tejidos utilizan los azcares para obtener energa. La falta de insulina o la falta de una respuesta normal a la insulina hace que el exceso de azcar se acumule en la sangre en lugar de penetrar en las clulas de los tejidos. Como resultado, se producen niveles altos de azcar en la sangre (hiperglucemia). El efecto de los niveles altos de azcar (glucosa) puede causar muchos problemas.  FACTORES DE RIESGO Usted tiene mayor probabilidad de desarrollar diabetes gestacional si tiene antecedentes familiares de diabetes y tambin si tiene uno o ms de los siguientes factores de riesgo:  ndice de masa corporal superior a 30 (obesidad).  Embarazo previo con diabetes gestacional.  La edad avanzada en el momento del embarazo. Si se mantienen los niveles de glucosa en la sangre en un rango normal durante el embarazo, las mujeres pueden tener un embarazo saludable. Si los niveles de glucosa en la sangre no estn bien controlados, puede haber riesgos para usted, el feto o el recin nacido, o durante el trabajo de parto y el parto.  SNTOMAS  Si se presentan sntomas, stos son similares a los sntomas que normalmente experimentar durante el embarazo. Los sntomas de la diabetes gestacional son:   Aumento de la sed (polidipsia).  Aumento de la miccin (poliuria).  Orina con ms frecuencia durante la noche (nocturia).  Prdida de peso. La prdida de peso puede ser muy rpida.  Infecciones  frecuentes y recurrentes.  Cansancio (fatiga).  Debilidad.  Cambios en la visin, como visin borrosa.  Olor a fruta en el aliento.  Dolor abdominal. DIAGNSTICO La diabetes se diagnostica cuando hay aumento de los niveles de glucosa en la sangre. El nivel de glucosa en la sangre puede controlarse en uno o ms de los siguientes anlisis de sangre:  Medicin de glucosa en la sangre en ayunas. No se le permitir comer durante al menos 8 horas antes de que se tome una muestra de sangre.  Pruebas al azar de glucosa en la sangre. El nivel de glucosa en la sangre se controla en cualquier momento del da sin importar el momento en que haya comido.  Prueba de tolerancia a la glucosa oral (PTGO). La glucosa en la sangre se mide despus de no haber comido (ayunas) durante una a tres horas y despus de beber una bebida que contenga glucosa. Dado que las hormonas que causan la resistencia a la insulina son ms altas alrededor de las semanas 24 a 28 de embarazo, generalmente se realiza una PTGO durante ese tiempo. Si tiene factores de riesgo, en la primera visita prenatal pueden hacerle pruebas de deteccin de diabetes tipo 2 no diagnosticada. TRATAMIENTO  La diabetes gestacional debe controlarse en primer lugar con dieta y ejercicios. Pueden agregarse medicamentos, pero solo si son necesarios.  Usted tendr que tomar medicamentos para la diabetes o insulina diariamente para mantener los niveles de glucosa en la sangre en el rango deseado.  Usted tendr que combinar la dosis de insulina con la actividad fsica y la eleccin de alimentos saludables. Si tiene diabetes gestacional, el objetivo del tratamiento ser   mantener los siguientes niveles sanguneos de glucosa:  Antes de las comidas (preprandial): valor de 95 mg/dl o inferior.  Despus de las comidas (posprandial):  Una hora despus de la comida: valor de 140 mg/dl o inferior.  Dos horas despus de la comida: valor de 120 mg/dl o  inferior. Si tiene diabetes tipo 1 o tipo 2 preexistente, el objetivo del tratamiento ser mantener los siguientes niveles sanguneos de glucosa:  Antes de las comidas, a la hora de acostarse y durante la noche: de 60 a 99 mg/dl.  Despus de las comidas: valor mximo de 100 a 129 mg/dl. INSTRUCCIONES PARA EL CUIDADO EN EL HOGAR   Controle su nivel de hemoglobina A1c dos veces al ao.  Contrlese a diario el nivel de glucosa en la sangre segn las indicaciones de su mdico. Es comn realizar controles frecuentes de la glucosa en la sangre.  Supervise las cetonas en la orina cuando est enferma y segn las indicaciones de su mdico.  Tome el medicamento para la diabetes y adminstrese insulina segn las indicaciones de su mdico para mantener el nivel de glucosa en la sangre en el rango deseado.  Nunca se quede sin medicamento para la diabetes o sin insulina. Es necesario que la reciba todos los das.  Ajuste la insulina segn la ingesta de hidratos de carbono. Los hidratos de carbono pueden aumentar los niveles de glucosa en la sangre, pero deben incluirse en su dieta. Los hidratos de carbono aportan vitaminas, minerales y fibra que son una parte esencial de una dieta saludable. Los hidratos de carbono se encuentran en frutas, verduras, cereales integrales, productos lcteos, legumbres y alimentos que contienen azcares aadidos.  Consuma alimentos saludables. Alterne 3 comidas con 3 colaciones.  Aumente de peso saludablemente. El aumento del peso total vara de acuerdo con el ndice de masa corporal que tena antes del embarazo (IMC).  Lleve una tarjeta de alerta mdica o use una pulsera o medalla de alerta mdica.  Lleve con usted una colacin de 15gramos de hidratos de carbono en todo momento para controlar los niveles bajos de glucosa en la sangre (hipoglucemia). Algunos ejemplos de colaciones de 15gramos de hidratos de carbono son los siguientes:  Tabletas de glucosa, 3 o 4.  Gel  de glucosa, tubo de 15 gramos.  Pasas de uva, 2 cucharadas (24 g).  Caramelos de goma, 6.  Galletas de animales, 8.  Jugo de fruta, gaseosa comn, o leche descremada, 4 onzas (120 ml).  Pastillas de goma, 9.  Reconocer la hipoglucemia. Durante el embarazo la hipoglucemia se produce cuando hay niveles de glucosa en la sangre de 60 mg/dl o menos. El riesgo de hipoglucemia aumenta durante el ayuno o cuando se saltea las comidas, durante o despus de realizar ejercicio intenso y mientras duerme. Los sntomas de hipoglucemia son:  Temblores o sacudidas.  Disminucin de la capacidad de concentracin.  Sudoracin.  Aumento de la frecuencia cardaca.  Dolor de cabeza.  Sequedad en la boca.  Hambre.  Irritabilidad.  Ansiedad.  Sueo agitado.  Alteracin del habla o de la coordinacin.  Confusin.  Tratar la hipoglucemia rpidamente. Si usted est alerta y puede tragar con seguridad, siga la regla de 15/15 que consiste en:  Tome entre 15 y 20gramos de glucosa de accin rpida o carbohidratos. Las opciones de accin rpida son un gel de glucosa, tabletas de glucosa, o 4 onzas (120 ml) de jugo de frutas, gaseosa comn, o leche baja en grasa.  Compruebe su nivel de glucosa en la sangre   15 minutos despus de tomar la glucosa.  Tome entre 15 y 20 gramos ms de glucosa si el nivel de glucosa en la sangre todava es de 70mg/dl o inferior.  Ingiera una comida o una colacin en el lapso de 1 hora una vez que los niveles de glucosa en la sangre vuelven a la normalidad.  Est atento a la poliuria (miccin excesiva) y la polidipsia (sensacin de mucha sed), que son los primeros signos de la hiperglucemia. El reconocimiento temprano de la hiperglucemia permite un tratamiento oportuno. Trate la hiperglucemia segn le indic su mdico.  Haga actividad fsica por lo menos 30minutos al da o como lo indique su mdico. Se recomienda que 30 minutos despus de cada comida, realice diez minutos  de actividad fsica para controlar los niveles de glucosa postprandial en la sangre.  Ajuste su dosis de insulina y la ingesta de alimentos, segn sea necesario, si inicia un nuevo ejercicio o deporte.  Siga su plan para los das de enfermedad cuando no pueda comer o beber como de costumbre.  Evite el tabaco y el alcohol.  Concurra a todas las visitas de control como se lo haya indicado el mdico.  Siga el consejo del mdico respecto a los controles prenatales y posteriores al parto (postparto), las visitas, la planificacin de las comidas, el ejercicio, los medicamentos, las vitaminas, los anlisis de sangre, otras pruebas mdicas y actividades fsicas.  Realice diariamente el cuidado de la piel y de los pies. Examine su piel y los pies diariamente para ver si tiene cortes, moretones, enrojecimiento, problemas en las uas, sangrado, ampollas o llagas.  Cepllese los dientes y encas por lo menos dos veces al da y use hilo dental al menos una vez por da. Concurra regularmente a las visitas de control con el dentista.  Programe un examen de vista durante el primer trimestre de su embarazo o como lo indique su mdico.  Comparta su plan de control de diabetes en el trabajo o en la escuela.  Mantngase al da con las vacunas.  Aprenda a manejar el estrs.  Obtenga la mayor cantidad posible de informacin sobre la diabetes y solicite ayuda siempre que sea necesario.  Obtenga informacin sobre el amamantamiento y analice esta posibilidad.  Debe controlar el nivel de azcar en la sangre de 6a 12semanas despus del parto. Esto se hace con una prueba de tolerancia a la glucosa oral (PTGO). SOLICITE ATENCIN MDICA SI:   No puede comer alimentos o beber por ms de 6 horas.  Tuvo nuseas o ha vomitado durante ms de 6 horas.  Tiene un nivel de glucosa en la sangre de 200 mg/dl y cetonas en la orina.  Presenta algn cambio en el estado mental.  Desarrolla problemas de visin.  Sufre  un dolor persistente de cabeza.  Siente dolor o molestias en la parte superior del abdomen.  Desarrolla una enfermedad grave adicional.  Tuvo diarrea durante ms de 6 horas.  Ha estado enfermo o ha tenido fiebre durante un par de das y no mejora. SOLICITE ATENCIN MDICA DE INMEDIATO SI:   Tiene dificultad para respirar.  Ya no siente los movimientos del beb.  Est sangrando o tiene flujo vaginal.  Comienza a tener contracciones o trabajo de parto prematuro. ASEGRESE DE QUE:  Comprende estas instrucciones.  Controlar su afeccin.  Recibir ayuda de inmediato si no mejora o si empeora.   Esta informacin no tiene como fin reemplazar el consejo del mdico. Asegrese de hacerle al mdico cualquier pregunta que tenga.     Document Released: 08/04/2005 Document Revised: 11/15/2014 Elsevier Interactive Patient Education 2016 [employer].  [place]­ trimestre de Psychiatrist (Second Trimester of Pregnancy) El segundo trimestre va desde la semana13 hasta la 28, desde el cuarto hasta el sexto mes, y suele ser el momento en el que mejor se siente. Su organismo se ha adaptado a Charity fundraiser y comienza a Diplomatic Services operational officer. En general, las nuseas matutinas han disminuido o han desaparecido completamente, puede tener ms energa y un aumento de apetito. El segundo trimestre es tambin la poca en la que el feto se desarrolla rpidamente. Hacia el final del sexto mes, el feto mide aproximadamente 9pulgadas (23cm) y pesa alrededor de 1 libras (700g). Es probable que sienta que el beb se Teacher, English as a foreign language (da pataditas) entre las 18 y 20semanas del Psychiatrist. CAMBIOS EN EL ORGANISMO Su organismo atraviesa por muchos cambios durante el [place], y estos varan de Neomia Dear mujer a Educational psychologist.   Seguir [employer]. Notar que la parte baja del abdomen sobresale.  Podrn aparecer las primeras [employer] caderas, el abdomen y las [place].  Es posible que tenga dolores de cabeza que pueden  aliviarse con los medicamentos que el mdico le permita tomar.  Tal vez tenga necesidad de orinar con ms frecuencia porque el feto est ejerciendo presin Ambulance person.  Debido al Vanetta Mulders podr sentir Anthoney Harada estomacal con frecuencia.  Puede estar estreida, ya que ciertas hormonas enlentecen los movimientos de los msculos que [employer] desechos a travs de los intestinos.  Pueden aparecer hemorroides o abultarse e hincharse las venas (venas varicosas).  Puede tener dolor de espalda que se debe al [employer] de peso y a que las hormonas del Management consultant las articulaciones entre los huesos de la pelvis, y Public librarian consecuencia de la modificacin del peso y los msculos que mantienen el equilibrio.  Las [employer] seguirn creciendo y Development worker, community.  Las Veterinary surgeon y estar sensibles al cepillado y al hilo dental.  Pueden aparecer zonas oscuras o manchas (cloasma, mscara del Psychiatrist) en el rostro que probablemente se atenuar despus del nacimiento del beb.  Es posible que se forme una lnea oscura desde el ombligo hasta la zona del pubis (linea nigra) que probablemente se atenuar despus del nacimiento del beb.  Tal vez haya cambios en el cabello que pueden incluir su engrosamiento, crecimiento rpido y cambios en la textura. Adems, a algunas mujeres se les cae el cabello durante o despus del embarazo, o tienen el cabello seco o fino. Lo ms probable es que el cabello se le normalice despus del nacimiento del beb. QU DEBE ESPERAR EN LAS CONSULTAS PRENATALES Durante una visita prenatal de rutina:  La pesarn para asegurarse de que usted y el feto estn creciendo normalmente.  Le tomarn la presin arterial.  Le medirn el abdomen para controlar el desarrollo del beb.  Se escucharn los latidos cardacos fetales.  Se evaluarn los resultados de los estudios solicitados en visitas anteriores. El mdico puede preguntarle lo siguiente:  Cmo se siente.  Si siente los  movimientos del beb.  Si ha tenido sntomas anormales, como prdida de lquido, [place], dolores de cabeza intensos o clicos abdominales.  Si est consumiendo algn producto que contenga tabaco, como cigarrillos, tabaco de Theatre manager y Administrator, Civil Service.  Si tiene [employer]. Otros estudios que podrn realizarse durante el segundo trimestre incluyen lo siguiente:  Anlisis de sangre para detectar lo siguiente:  Concentraciones de hierro bajas (anemia).  Diabetes gestacional (entre la semana 24 y la 28).  Anticuerpos Rh.  Anlisis de orina para detectar infecciones, diabetes o protenas en la orina.  Una ecografa para confirmar que el beb crece y se desarrolla correctamente.  Una amniocentesis para diagnosticar posibles problemas genticos.  Estudios del feto para descartar espina bfida y sndrome de [place].  Prueba del VIH (virus de inmunodeficiencia humana). Los exmenes prenatales de rutina incluyen la prueba de deteccin del VIH, a menos que decida no Futures trader. INSTRUCCIONES PARA EL CUIDADO EN EL HOGAR   Evite fumar, consumir hierbas, beber alcohol y tomar frmacos que no le hayan recetado. Estas sustancias qumicas afectan la formacin y el desarrollo del beb.  No consuma ningn producto que contenga tabaco, lo que incluye cigarrillos, tabaco de Theatre manager y Administrator, Civil Service. Si necesita ayuda para dejar de fumar, consulte al [employer]. Puede recibir asesoramiento y otro tipo de recursos para dejar de fumar.  Siga las indicaciones del mdico en relacin con el uso de medicamentos. Durante el embarazo, hay medicamentos que son seguros de tomar y otros que no.  Haga ejercicio solamente como se lo haya indicado el mdico. Sentir clicos uterinos es un buen signo para Restaurant manager, fast food actividad fsica.  Contine comiendo alimentos sanos con regularidad.  Use un sostn que le brinde buen soporte si le [employer].  No se d baos de inmersin en agua caliente,  baos turcos ni saunas.  Use el cinturn de seguridad en todo momento mientras conduce.  No coma carne cruda ni queso sin cocinar; evite el contacto con las bandejas sanitarias de los gatos y la tierra que estos animales usan. Estos elementos contienen grmenes que pueden causar defectos congnitos en el beb.  Tome las vitaminas prenatales.  Tome entre 1500 y  de calcio diariamente comenzando en la semana20 del embarazo [place].  Si est estreida, pruebe un laxante suave (si el mdico lo autoriza). Consuma ms alimentos ricos en fibra, como vegetales y frutas frescos y Radiation protection practitioner. Beba gran cantidad de lquido para mantener la orina de tono claro o color amarillo plido.  Dese baos de asiento con agua tibia para Engineer, materials o las molestias causadas por las hemorroides. Use una crema para las hemorroides si el mdico la autoriza.  Si tiene venas varicosas, use medias de descanso. Eleve los pies durante , 3 o 4veces por da. Limite el consumo de sal en su dieta.  No levante objetos pesados, use zapatos de tacones bajos y [address].  Descanse con las piernas elevadas si tiene calambres o dolor de cintura.  Visite a su dentista si an no lo ha Occupational hygienist. Use un cepillo de dientes blando para higienizarse los dientes y psese el hilo dental con suavidad.  Puede seguir [employer], a menos que el mdico le indique lo contrario.  Concurra a todas las visitas prenatales segn las indicaciones de su mdico. SOLICITE ATENCIN MDICA SI:   [place].  Siente clicos leves, presin en la pelvis o dolor persistente en el abdomen.  Tiene nuseas, vmitos o diarrea persistentes.  Brett Fairy secrecin vaginal con mal olor.  Siente dolor al [employer]. SOLICITE ATENCIN MDICA DE INMEDIATO SI:   Tiene fiebre.  Tiene una prdida de lquido por la vagina.  Tiene sangrado o pequeas prdidas  vaginales.  Siente dolor intenso o clicos en el abdomen.  Sube o baja de peso rpidamente.  Tiene dificultad para respirar y siente dolor de pecho.  Sbitamente se le [employer], las [place], [place],  los pies o las piernas.  No ha sentido los movimientos del beb durante Georgianne Fick.  Siente un dolor de cabeza intenso que no se alivia con medicamentos.  Su visin se modifica.   Esta informacin no tiene Theme park manager el consejo del mdico. Asegrese de hacerle al mdico cualquier pregunta que tenga.   Document Released: 08/04/2005 Document Revised: 11/15/2014 Elsevier Interactive Patient Education 2016 [employer].  [employer] del mtodo anticonceptivo (Contraception Choices) La anticoncepcin (control de la natalidad) es el uso de cualquier mtodo o dispositivo para Location manager. A continuacin se indican algunos de esos mtodos. MTODOS HORMONALES   El Implante contraconceptivo consiste en un tubo plstico delgado que contiene la hormona progesterona. No contiene estrgenos. El mdico inserta el tubo en la parte interna del brazo. El tubo puede Geneticist, molecular durante 3 aos. Despus de los 3 aos debe retirarse. El implante impide que los ovarios liberen vulos (ovulacin), espesa el moco cervical, lo que evita que los espermatozoides ingresen al tero y hace ms delgada la membrana que cubre el interior del tero.  Inyecciones de progesterona sola: las Insurance underwriter cada 3 meses para Location manager. La progesterona sinttica impide que los ovarios liberen vulos. Tambin hacen que el moco cervical se espese y modifique el tejido de recubrimiento interno del tero. Esto hace ms difcil que los espermatozoides sobrevivan en el tero.  Las pldoras anticonceptivas contienen estrgenos y Education officer, museum. Su funcin es [employer] ovarios liberen vulos (ovulacin). Las hormonas de los anticonceptivos orales hacen que el moco cervical se haga  ms espeso, lo que evita que el esperma ingrese al tero. Las pldoras anticonceptivas son recetadas por el mdico.Tambin se utilizan para tratar los perodos menstruales abundantes.  Minipldora: este tipo de pldora anticonceptiva contiene slo hormona progesterona. Deben tomarse todos los [address] del mes y debe recetarlas el mdico.  El parche de control de natalidad: contiene hormonas similares a las que contienen las pldoras anticonceptivas. Deben cambiarse una vez por semana y se utilizan bajo prescripcin mdica.  Anillo vaginal: contiene hormonas similares a las que contienen las pldoras anticonceptivas. Se deja colocado durante tres semanas, se lo retira durante 1 semana y luego se coloca uno nuevo. La paciente debe sentirse cmoda al insertar y retirar el anillo de la vagina.Es necesaria la prescripcin mdica.  Anticonceptivos de emergencia: son mtodos para evitar un embarazo despus de Neomia Dear relacin sexual sin proteccin. Esta pldora puede tomarse inmediatamente despus de Child psychotherapist sexuales o hasta 5 [place] de haber tenido sexo sin proteccin. Es ms efectiva si se toma poco tiempo despus de la relacin sexual. Los anticonceptivos de emergencia estn disponibles sin prescripcin mdica. Consltelo con su farmacutico. No use los anticonceptivos de emergencia como nico mtodo anticonceptivo. MTODOS DE [name]   Condn masculino: es una vaina delgada (ltex o goma) que se coloca cubriendo al pene durante el acto sexual. Deri Fuelling con espermicida para aumentar la efectividad.  Condn femenino. Es una funda delicada y blanda que se adapta holgadamente a la vagina antes de las Clinical research associate.  Diafragma: es una barrera de ltex redonda y suave que debe ser recomendado por un profesional. Se inserta en la vagina, junto con un gel espermicida. Debe insertarse antes de Management consultant. Debe dejar el diafragma colocado en la vagina durante 6 a 8 horas despus de la  relacin sexual.  Capuchn cervical: es una barrera de ltex o taza plstica redonda y [place] que cubre el cuello del tero y  debe ser colocada por un mdico. Puede dejarlo colocado en la vagina hasta 48 horas despus de las [employer].  Esponja: es una pieza blanda y circular de espuma de poliuretano. Contiene un espermicida. Se inserta en la vagina despus de mojarla y antes de las [employer].  Espermicidas: son sustancias qumicas que matan o bloquean al esperma y no lo dejan ingresar al cuello del tero y al tero. Vienen en forma de cremas, geles, supositorios, espuma o comprimidos. No es necesario tener Emergency planning/management officer. Se insertan en la vagina con un aplicador antes de Management consultant. El proceso debe repetirse cada vez que tiene relaciones sexuales. ANTICONCEPTIVOS INTRAUTERINOS  Dispositivo intrauterino (DIU) es un dispositivo en forma de T que se coloca en el tero durante el perodo menstrual, para Location manager. Hay dos tipos:  DIU de cobre: este tipo de DIU est recubierto con un alambre de cobre y se inserta dentro del tero. El cobre hace que el tero y las trompas de Falopio produzcan un liquido que [employer] espermatozoides. Puede permanecer colocado durante 10 aos.  DIU con hormona: este tipo de DIU contiene la hormona progestina (progesterona sinttica). La hormona espesa el moco cervical y evita que los espermatozoides ingresen al tero y tambin afina la membrana que cubre el tero para evitar la implantacin del vulo fertilizado. La hormona debilita o destruye los espermatozoides que ingresan al tero. Puede Geneticist, molecular durante 3-5 aos, segn el tipo de DIU que se [place]. MTODOS ANTICONCEPTIVOS PERMANENTES  Ligadura de trompas en la mujer: se realiza sellando, atando u obstruyendo quirrgicamente las trompas de Falopio lo que impide que el vulo descienda hacia el tero.  Esterilizacin histeroscpica: Implica la colocacin de  un pequeo espiral o la insercin en cada trompa de Falopio. El mdico utiliza una tcnica llamada histeroscopa para Primary school teacher procedimiento. El dispositivo produce la formacin de tejido Designer, television/film set. Esto da como resultado una obstruccin permanente de las trompas de Falopio, de modo que la esperma no pueda fertilizar el vulo. Demora alrededor de 3 meses despus del procedimiento hasta que el conducto se obstruye. Tendr que usar otro mtodo anticonceptivo durante al menos 3 meses.  Esterilizacin masculina: se realiza ligando los conductos por los que pasan los espermatozoides (vasectoma).Esto impide que el esperma ingrese a la vagina durante el acto sexual. Luego del procedimiento, el hombre puede eyacular lquido (semen). MTODOS DE PLANIFICACIN NATURAL  Planificacin familiar natural: consiste en no Management consultant o usar un mtodo de barrera (condn, Weinert, capuchn cervical) en los [employer] la mujer podra quedar [place].  Mtodo de calendario: consiste en el seguimiento de la duracin de cada ciclo menstrual y la identificacin de los perodos frtiles.  Mtodo de ovulacin: Paramedic las relaciones sexuales durante la ovulacin.  Mtodo sintotrmico: Advertising copywriter sexuales en la poca en la que se est ovulando, utilizando un termmetro y tendiendo en cuenta los sntomas de la ovulacin.  Mtodo postovulacin: Youth worker las relaciones sexuales para despus de haber ovulado. Independientemente del tipo o mtodo anticonceptivo que usted elija, es importante que use condones para protegerse contra las infecciones de transmisin sexual (ETS). Hable con su mdico con respecto a qu mtodo anticonceptivo es el ms apropiado para usted.   Esta informacin no tiene Theme park manager el consejo del mdico. Asegrese de hacerle al mdico cualquier pregunta que tenga.   Document Released: 10/25/2005 Document Revised:  06/27/2013 Elsevier Interactive Patient Education Yahoo[employer] Inc.  Depresin  posparto y baby blues (Postpartum Depression and Baby Blues) El perodo del posparto comienza inmediatamente despus del nacimiento de un beb y suele ser una poca de gran felicidad y mucho entusiasmo. Tambin es tiempo de muchos cambios en la vida de [place]. Sin importar cuntos hijos tenga una madre, cada nio plantea nuevos desafos y Neomia Dear nueva dinmica a la familia. Es frecuente que haya sentimientos de entusiasmo junto con cambios confusos en el estado de nimo, las emociones y los pensamientos. Todas las madres corren riesgo de tener depresin posparto o "baby blues". Estos cambios en el estado de nimo pueden presentarse inmediatamente despus del parto o muchos meses despus del nacimiento de un nio. El baby blues o la depresin posparto pueden ser leves o graves. Adems, la depresin posparto puede desaparecer con bastante rapidez o puede ser una enfermedad de larga evolucin.  CAUSAS Se cree que el aumento de las concentraciones hormonales y su rpida disminucin es la causa principal de la depresin posparto y el baby blues. Durante y despus del [place], un nmero de hormonas [place]. El estrgeno y la progesterona generalmente disminuyen inmediatamente despus del parto. Tambin disminuyen con Pra[employer] de la hormona tiroidea y de varios esteroides del cortisol. Otros factores que juegan un papel en estos cambios anmicos incluyen los sucesos importantes de la vida y Designer, industrial/product.  FACTORES DE RIESGO Si tiene alguno de los siguientes riesgos de desarrollar baby blues o depresin posparto, sepa a qu sntomas debe estar atenta durante el perodo del posparto. Los factores de riesgo que pueden aumentar la probabilidad de Warehouse manager baby blues o depresin posparto incluyen lo siguiente:  Antecedentes personales o familiares de depresin.  Depresin Academic librarian.  Problemas anmicos  premenstruales o que guardan relacin con los anticonceptivos orales.  Mucho estrs en la vida.  Conflictos maritales.  Falta de una red de apoyo social.  Wilburt Finlay un beb con necesidades especiales.  Problemas de salud, como diabetes. SIGNOS Y SNTOMAS Los sntomas de baby blues incluyen lo siguiente:  Cambios en el estado de nimo durante lapsos breves, como pasar de la felicidad extrema a la tristeza.  Falta de concentracin.  Dificultad para dormir.  Ataques de llanto, sensibilidad emocional.  Irritabilidad.  Ansiedad. Generalmente, los sntomas de depresin posparto comienzan en el trmino del primer mes despus del parto. Estos sntomas incluyen:  Dificultad para dormir o somnolencia excesiva.  Prdida de peso notable.  Agitacin.  Sentimientos de inutilidad.  Falta de inters en las actividades o la comida. La psicosis posparto es una enfermedad muy grave que puede ser peligrosa. Afortunadamente, es poco frecuente. Si aparece alguno de los siguientes sntomas, se debe recibir atencin mdica de inmediato. Los sntomas de psicosis posparto incluyen lo siguiente:   Alucinaciones y delirios.  Comportamiento atpico o desorganizado.  Confusin o desorientacin. DIAGNSTICO  El diagnstico se realiza mediante la evaluacin de los sntomas. No hay exmenes mdicos ni pruebas de laboratorio que permitan hacer un diagnstico, pero hay varios cuestionarios que el mdico puede usar para identificar a las personas que tienen baby blues, depresin posparto o psicosis. A menudo se [place] una herramienta de deteccin sistemtica llamada [place] de depresin posnatal de [place] para diagnosticar la depresin durante el perodo del posparto.  TRATAMIENTO Generalmente, el baby blues desaparece solo en el trmino de 1 o 2semanas. A menudo, todo lo que se necesita es apoyo social. Se le recomendar que duerma y descanse lo suficiente. En ocasiones, se le pueden administrar medicamentos  para ayudarla a dormir.  La depresin posparto requiere tratamiento porque puede durar varios meses o ms tiempo si no se la trata. El tratamiento puede incluir terapia individual o grupal, medicamentos o ambos para abordar los factores Anna, fisiolgicos y psicolgicos que pueden tener influencia en la depresin. Tambin pueden recomendarse enfticamente el ejercicio regular, una alimentacin sana, el descanso y el apoyo social.  La psicosis posparto es una enfermedad ms grave y requiere tratamiento inmediato. A menudo es necesaria la hospitalizacin. INSTRUCCIONES PARA EL CUIDADO EN EL HOGAR  Descanse todo lo posible. Tome una siesta cuando el beb duerme.  Haga ejercicios regularmente. Para algunas mujeres, el yoga y las caminatas son beneficiosas.  Consuma una dieta equilibrada y nutritiva.  Haga pequeas cosas que disfruta. Tome una taza de t, dese un bao de burbujas, lea su revista favorita o escuche su msica predilecta.  Evite el alcohol.  Pida ayuda con los [employer], la cocina, las compras de comida o las obligaciones diarias si lo necesita. No intente hacer todo.  Hable con personas allegadas sobre cmo se siente. Busque el apoyo de su pareja, sus familiares, sus amigos o de [employer] primerizas.  Intente pensar positivamente. Piense en aquellas cosas por las que se siente agradecida.  No pase mucho tiempo sola.  Tome solo medicamentos de venta libre o recetados, segn las indicaciones del mdico.  Cumpla con todos los controles del postparto.  Infrmele a su mdico sobre cualquier inquietud que tenga. SOLICITE ATENCIN MDICA SI: [place] reaccin al medicamento o problemas con este. SOLICITE ATENCIN MDICA DE INMEDIATO SI:  Tiene sentimientos suicidas.  Cree que podra lastimar al beb o a Engineer, maintenance (IT). ASEGRESE DE QUE:  Comprende estas instrucciones.  Controlar su afeccin.  Recibir ayuda de inmediato si no mejora o si empeora.    Esta informacin no tiene Theme park manager el consejo del mdico. Asegrese de hacerle al mdico cualquier pregunta que tenga.   Document Released: 04/12/2008 Document Revised: 10/30/2013 Elsevier Interactive Patient Education 2016 [employer].   [place] materna (Breastfeeding) Decidir Museum/gallery exhibitions officer es una de las mejores elecciones que puede hacer por usted y su beb. El cambio hormonal durante el Psychiatrist produce el desarrollo del tejido mamario y [place] la cantidad y el tamao de los conductos galactforos. Estas hormonas tambin permiten que las protenas, los azcares y las grasas de la sangre produzcan la [employer] materna en las glndulas productoras de [place]. Las hormonas impiden que la leche materna sea liberada antes del nacimiento del beb, adems de impulsar el flujo de leche luego del nacimiento. Una vez que ha comenzado a Museum/gallery exhibitions officer, Conservation officer, nature beb, as Immunologist succin o Theatre manager, pueden estimular la liberacin de [place] de las glndulas productoras de [place].  LOS BENEFICIOS DE AMAMANTAR Para el beb  La primera leche (calostro) ayuda a Careers information officer funcionamiento del sistema digestivo del beb.  La leche tiene anticuerpos que ayudan a Radio producer las infecciones en el beb.  El beb tiene una menor incidencia de asma, alergias y del sndrome de muerte sbita del lactante.  Los nutrientes en la [place] materna son mejores para el beb que la [place] maternizada y estn preparados exclusivamente para cubrir las necesidades del beb.  La leche materna mejora el desarrollo cerebral del beb.  Es menos probable que el beb desarrolle otras enfermedades, como obesidad infantil, asma o diabetes mellitus de tipo 2. Para usted   La lactancia materna favorece el desarrollo de un vnculo muy especial entre la madre y el beb.  Es conveniente.  La leche materna siempre est disponible a la Human resources officer y es Sammy Martinez.  La lactancia materna ayuda a quemar caloras y a perder el peso  ganado durante el [place].  Favorece la contraccin del tero al tamao que tena antes del embarazo de manera ms rpida y disminuye el sangrado (loquios) despus del parto.  La lactancia materna contribuye a reducir Nurse, adult de desarrollar diabetes mellitus de tipo 2, osteoporosis o cncer de mama o de ovario en el futuro. SIGNOS DE QUE EL BEB EST HAMBRIENTO Primeros signos de [address] de [place].  Se estira.  Mueve la cabeza de un lado a otro.  Mueve la cabeza y abre la boca cuando se le toca la mejilla o la comisura de la boca (reflejo de bsqueda).  Aumenta las vocalizaciones, tales como sonidos de succin, se relame los labios, emite arrullos, suspiros, o chirridos.  Mueve la [employer] boca.  Se chupa con ganas los dedos o las manos. Signos tardos de Fisher [employer].  Llora de manera intermitente. Signos de [employer] Corporation signos de hambre extrema requerirn que lo calme y lo consuele antes de que el beb pueda alimentarse adecuadamente. No espere a que se manifiesten los siguientes signos de hambre extrema para comenzar a Museum/gallery exhibitions officer:   Designer, jewellery.  Llanto intenso y fuerte.  Gritos. INFORMACIN BSICA SOBRE LA LACTANCIA MATERNA Iniciacin de la lactancia materna  Encuentre un lugar cmodo para sentarse o acostarse, con un buen respaldo para el cuello y la espalda.  Coloque una almohada o una manta enrollada debajo del beb para acomodarlo a la altura de la mama (si est sentada). Las almohadas para Museum/gallery exhibitions officer se han diseado especialmente a fin de servir de apoyo para los brazos y el beb [employer].  Asegrese de que el abdomen del beb est frente al suyo.   Masajee suavemente la mama. Con las yemas de los dedos, masajee la pared del pecho hacia el pezn en un movimiento circular. Esto estimula el flujo de [place]. Es posible que Engineer, manufacturing systems este movimiento mientras amamanta si la leche fluye lentamente.  Sostenga  la mama con el pulgar por arriba del pezn y los otros 4 dedos por debajo de la mama. Asegrese de que los dedos se encuentren lejos del pezn y de la boca del beb.  Empuje suavemente los labios del beb con el pezn o con el dedo.  Cuando la boca del beb se abra lo suficiente, acrquelo rpidamente a la mama e introduzca todo el pezn y la zona oscura que lo rodea (areola), tanto como sea posible, dentro de la boca del beb.  Debe haber ms areola visible por arriba del labio superior del beb que por debajo del labio inferior.  La lengua del beb debe estar entre la enca inferior y la [place].  Asegrese de que la boca del beb est en la posicin correcta alrededor del pezn (prendida). Los labios del beb deben crear un sello sobre la mama y estar doblados hacia afuera (invertidos).  Es comn que el beb succione durante 2 a 3 minutos para que comience el flujo de [place]. Cmo debe prenderse Es muy importante que le ensee al beb cmo prenderse adecuadamente a la mama. Si el beb no se prende adecuadamente, puede causarle dolor en el pezn y reducir la produccin de [place], y hacer que el beb tenga un escaso aumento de [place]. Adems, si el beb no se prende adecuadamente al pezn, puede  tragar aire durante la alimentacin. Esto puede causarle molestias al beb. Hacer eructar al beb al [place] Plate de mama puede ayudarlo a liberar el aire. Sin embargo, ensearle al beb cmo prenderse a la mama adecuadamente es la mejor manera de evitar que se sienta molesto por tragar Oceanographer se alimenta. Signos de que el beb se ha prendido adecuadamente al pezn:   Payton Doughty o succiona de modo silencioso, sin causarle dolor.  Se escucha que traga cada 3 o 4 succiones.  Hay movimientos musculares por arriba y por delante de sus odos al Printmaker. Signos de que el beb no se ha prendido Audiological scientist al pezn:   Hace ruidos de succin o de chasquido mientras se alimenta.  Siente dolor en  el pezn. Si cree que el beb no se prendi correctamente, deslice el dedo en la comisura de la boca y [employer] Corporation las encas del beb para interrumpir la succin. Intente comenzar a amamantar nuevamente. Signos de Fish farm manager Signos del beb:   Disminuye gradualmente el nmero de succiones o cesa la succin por completo.  Se duerme.  Relaja el cuerpo.  Retiene una pequea cantidad de [facility] boca.  Se desprende solo del pecho. Signos que presenta usted:  Las mamas han aumentado la firmeza, el peso y el tamao 1 a 3 horas despus de Museum/gallery exhibitions officer.  Estn ms blandas inmediatamente despus de amamantar.  Un aumento del volumen de [place], y tambin un cambio en su consistencia y color se producen hacia el quinto da de Tour manager.  Los pezones no duelen, ni estn agrietados ni sangran. Signos de que su beb recibe la cantidad de leche suficiente  Moja al menos 3 paales en 24 horas. La orina debe ser clara y de color amarillo plido a los [address] de [place].  Defeca al menos 3 veces en 24 horas a los [address] de [address] La materia fecal debe ser blanda y [place].  Defeca al menos 3 veces en 24 horas a los [address] de [address] La materia fecal debe ser grumosa y [place].  No registra una prdida de peso mayor del 10% del peso al nacer durante los primeros 3 [address] de [place].  Aumenta de peso un promedio de 4 a 7onzas (113 a 198g) por semana despus de los [address] de vida.  Aumenta de [place], [place], de Pluckemin uniforme a Glass blower/designer de los [address] de vida, sin Passenger transport manager prdida de peso despus de las 2semanas de vida. Despus de alimentarse, es posible que el beb regurgite una pequea cantidad. Esto es frecuente. FRECUENCIA Y DURACIN DE LA LACTANCIA MATERNA El amamantamiento frecuente la ayudar a producir ms [place] y a Education officer, community de Engineer, mining en los pezones e hinchazn en las [place]. Alimente al beb cuando muestre signos de hambre o si siente la necesidad de  reducir la congestin de las [place]. Esto se denomina "lactancia a demanda". Evite el uso del chupete mientras trabaja para establecer la lactancia (las primeras 4 a 6 semanas despus del nacimiento del beb). Despus de este perodo, podr ofrecerle un chupete. Las investigaciones demostraron que el uso del chupete durante el primer ao de vida del beb disminuye el riesgo de desarrollar el sndrome de muerte sbita del lactante (SMSL). Permita que el nio se alimente en cada mama todo lo que desee. Contine amamantando al beb hasta que haya terminado de alimentarse. Cuando el beb se desprende o se queda dormido mientras se est alimentando de la primera mama, ofrzcale la segunda.  Debido a que, con frecuencia, los recin [place] las primeras semanas de vida, es posible que deba despertar al beb para alimentarlo. Los horarios de Acupuncturist de un beb a otro. Sin embargo, las siguientes reglas pueden servir como gua para ayudarla a Lawyer que el beb se alimenta adecuadamente:  Se puede amamantar a los recin nacidos (bebs de 4 semanas o menos de vida) cada 1 a 3 horas.  No deben transcurrir ms de 3 horas durante el da o 5 horas durante la noche sin que se amamante a los recin nacidos.  Debe amamantar al beb 8 veces como mnimo en un perodo de 24 horas, hasta que comience a introducir slidos en su dieta, a los 6 meses de vida aproximadamente. EXTRACCIN DE [employer] MATERNA La extraccin y Contractor de la leche materna le permiten asegurarse de que el beb se alimente exclusivamente de [place], aun en momentos en los que no puede amamantar. Esto tiene especial importancia si debe regresar al Aleen Campi en el perodo en que an est amamantando o si no puede estar presente en los momentos en que el beb debe alimentarse. Su asesor en lactancia puede orientarla sobre cunto tiempo es seguro almacenar [place].  El sacaleche es un aparato que le permite  extraer leche de la mama a un recipiente estril. Luego, la leche materna extrada puede almacenarse en un refrigerador o Electrical engineer. Algunos sacaleches son Birdie Riddle, Delaney Meigs otros son elctricos. Consulte a su asesor en lactancia qu tipo ser ms conveniente para usted. Los sacaleches se pueden comprar; sin embargo, algunos hospitales y grupos de apoyo a la lactancia materna alquilan Sports coach. Un asesor en lactancia puede ensearle cmo extraer [employer], en caso de que prefiera no usar un sacaleche.  CMO CUIDAR LAS MAMAS DURANTE LA LACTANCIA MATERNA Los pezones se secan, agrietan y duelen durante la Tour manager. Las siguientes recomendaciones pueden ayudarla a Pharmacologist las [employer] Partners y sanas:  Careers information officer usar jabn en los pezones.  Use un sostn de soporte. Aunque no son esenciales, las camisetas sin mangas o los sostenes especiales para Museum/gallery exhibitions officer estn diseados para acceder fcilmente a las mamas, para Museum/gallery exhibitions officer sin tener que quitarse todo el sostn o la camiseta. Evite usar sostenes con aro o sostenes muy ajustados.  Seque al aire sus pezones durante 3 a despus de amamantar al beb.  Utilice solo apsitos de Haematologist sostn para Environmental health practitioner las prdidas de [place]. La prdida de un poco de [employer] Enterprise Group tomas es normal.  Utilice lanolina sobre los pezones luego de Museum/gallery exhibitions officer. La lanolina ayuda a mantener la humedad normal de la piel. Si [place] lanolina pura, no tiene que lavarse los pezones antes de volver a Corporate treasurer al beb. La lanolina pura no es txica para el beb. Adems, puede extraer [employer] algunas gotas de [place] materna y Engineer, maintenance (IT) suavemente esa [employer], para que la [place] se seque al aire. Durante las primeras semanas despus de dar a luz, algunas mujeres pueden experimentar hinchazn en las mamas (congestin [place]). La congestin puede hacer que sienta las mamas pesadas, calientes y sensibles al tacto. El  pico de la congestin ocurre dentro de los 3 a 5 das despus del [place]. Las siguientes recomendaciones pueden ayudarla a Paramedic la congestin:  Vace por completo las mamas al [employer]COMM o Environmental health practitioner. Puede aplicar calor hmedo en las mamas (en la ducha o con toallas hmedas para manos) antes de Museum/gallery exhibitions officer o extraer W[employer] Resources. Esto aumenta  la circulacin y [place] a que la [place]. Si el beb no vaca por completo las [address] cuando lo [address], extraiga la [place] restante despus de que haya finalizado.  Use un sostn ajustado (para amamantar o comn) o una camiseta sin mangas durante 1 o 2 das para indicar al cuerpo que disminuya ligeramente la produccin de [place].  Aplique compresas de hielo Yahoo! Inc, a menos que le resulte demasiado incmodo.  Asegrese de que el beb est prendido y se encuentre en la posicin correcta mientras lo alimenta. Si la congestin persiste luego de 48 horas o despus de seguir estas recomendaciones, comunquese con su mdico o un Holiday representative. RECOMENDACIONES GENERALES PARA EL CUIDADO DE LA SALUD DURANTE LA LACTANCIA MATERNA  Consuma alimentos saludables. Alterne comidas y colaciones, y coma 3 de cada una por da. Dado que lo que come Dana[employer], es posible que algunas comidas hagan que su beb se vuelva ms irritable de lo habitual. Evite comer este tipo de alimentos si percibe que afectan de manera negativa al beb.  Beba leche, jugos de fruta y agua para Patent examiner su sed (aproximadamente 10 vasos al Futures trader).  Descanse con frecuencia, reljese y tome sus vitaminas prenatales para evitar la fatiga, el estrs y la anemia.  Contine con los autocontroles de la mama.  Evite Product manager y fumar tabaco. Las sustancias qumicas de los cigarrillos que pasan a la leche materna y la exposicin al humo ambiental del tabaco pueden daar al beb.  No consuma alcohol ni drogas, incluida la marihuana. Algunos medicamentos, que pueden ser perjudiciales  para el beb, pueden pasar a travs de la [employer]. Es importante que consulte a su mdico antes de Medical sales representative, incluidos todos los medicamentos recetados y de [place], as como los suplementos vitamnicos y herbales. Puede quedar embarazada durante la lactancia. Si desea controlar la natalidad, consulte a su mdico cules son las opciones ms seguras para el beb. SOLICITE ATENCIN MDICA SI:   Usted siente que quiere dejar de Museum/gallery exhibitions officer o se siente frustrada con la lactancia.  Siente dolor en las mamas o en los pezones.  Sus pezones estn agrietados o Water quality scientist.  Sus pechos estn irritados, sensibles o calientes.  Tiene un rea hinchada en cualquiera de las mamas.  Siente escalofros o fiebre.  Tiene nuseas o vmitos.  Presenta una secrecin de otro lquido distinto de la leche materna de los pezones.  Sus mamas no se llenan antes de Museum/gallery exhibitions officer al beb para el quinto da despus del [place].  Se siente triste y deprimida.  El beb est demasiado somnoliento como para comer bien.  El beb tiene problemas para dormir.  Moja menos de 3 paales en 24 horas.  Defeca menos de 3 veces en 24 horas.  La piel del beb o la parte blanca de los ojos se vuelven amarillentas.  El beb no ha aumentado de [place] a los [address] de [place]. SOLICITE ATENCIN MDICA DE INMEDIATO SI:   El beb est muy cansado Retail buyer) y no se quiere despertar para comer.  Le sube la fiebre sin causa.   Esta informacin no tiene Theme park manager el consejo del mdico. Asegrese de hacerle al mdico cualquier pregunta que tenga.   Document Released: 10/25/2005 Document Revised: 07/16/2015 Elsevier Interactive Patient Education [employer] Inc.

## 2016-05-31 NOTE — Progress Notes (Signed)
Elane Fritz used for interpreter for check in Patient needs to see Seward Grater for diabetes ed.

## 2016-05-31 NOTE — Progress Notes (Signed)
Diabetes Education: 05/31/16 Cheryl Haynes is seen for GDM education.  Reports a family history of type 2 DM.  EDD is 11/03/16.  The Spanish interpreter assist in the education session.  She ask questions and seeks clarification.  She is a self-pay client. Completed review of GDM and the self-care measures to prevent developing type 2 DM later in life. Completed review of the self-care measures to lower blood glucose.  Recommended walking 30 minutes daily in the cooler part of the day.  Completed review glucose monitoring and the glucose goals.  Instructed to monitor fasting and 2 hr post meal glucose levels; record in log book and to bring meter and log book to all clinic appointments. Provide a True Track meter: [id number] Exp: 2018-02-19 And 1 box strips, [id number] exp: 2018-03-07 and 1 box lancets [id number] NM exp date 2020/01/26. Completed review of the CHO restricted diet.  Provided handout "Nutrition, Diabetes and Pregnancy".written in Spanish. Maggie Helaina Stefano, RN, RD, LDN

## 2016-05-31 NOTE — Progress Notes (Signed)
  Subjective:    Cheryl Haynes is a [id number] [redacted]w[redacted]d being seen today for her first obstetrical visit.  Her obstetrical history is significant for advanced maternal age, obesity and preterm delivery and early diagnosis of GDM. Patient does intend to breast feed. Pregnancy history fully reviewed.  Patient reports no complaints.  Vitals:   05/31/16 0756  BP: 112/67  Pulse: 93  Weight: 174 lb (78.9 kg)    HISTORY: OB History  Gravida Para Term Preterm AB Living  5 3 2 1 1 3   SAB TAB Ectopic Multiple Live Births  1 0 0 0 3    # Outcome Date GA Lbr Len/2nd Weight Sex Delivery Anes PTL Lv  5 Current           4 Term 08/29/10 [redacted]w[redacted]d  8 lb 5 oz (3.771 kg) F Vag-Spont None  LIV  3 SAB 2010          2 Preterm 09/29/01 [redacted]w[redacted]d  5 lb 8 oz (2.495 kg) M Vag-Spont None  LIV  1 Term 10/30/98 [redacted]w[redacted]d  7 lb 11.5 oz (3.5 kg) F Vag-Spont EPI  LIV     Past Medical History:  Diagnosis Date  . Depression    postpartum after 1st delivery  . Varicose vein of leg    left leg   Past Surgical History:  Procedure Laterality Date  . NO PAST SURGERIES     Family History  Problem Relation Age of Onset  . Diabetes Father   . Hypertension Father      Exam        Assessment:    Pregnancy: [id number] Patient Active Problem List   Diagnosis Date Noted  . Supervision of high risk pregnancy, antepartum 05/31/2016  . Gestational diabetes mellitus (GDM), antepartum 05/31/2016  . History of preterm delivery, currently pregnant 05/31/2016        Plan:     Initial labs drawn. Prenatal vitamins. Problem list reviewed and updated. Genetic Screening discussed Quad Screen: patient is scheduled to meet with genetic counselor on 7/27.  Ultrasound discussed; fetal survey: ordered. Discussed the implication of diabetes in pregnancy. Discussed the importance of achieving euglycemia to decrease the risks of fetal macrosomia, shoulder dystocia, neonatal hypoglycemia and seizures and IUFD or neonatal  demise Will obtain baseline labs Will have patient meet with diabetic educator Patient with h/o PTD declines 17-P  Follow up in 2 weeks. 50% of 30 min visit spent on counseling and coordination of care.     Cheryl Haynes 05/31/2016

## 2016-05-31 NOTE — Addendum Note (Signed)
Addended by: Darrel Hoover on: 05/31/2016 05:18 PM   Modules accepted: Orders

## 2016-06-01 LAB — POCT URINALYSIS DIP (DEVICE)
Bilirubin Urine: NEGATIVE
Glucose, UA: NEGATIVE mg/dL
KETONES UR: NEGATIVE mg/dL
Nitrite: NEGATIVE
PH: 6 (ref 5.0–8.0)
PROTEIN: NEGATIVE mg/dL
Specific Gravity, Urine: 1.025 (ref 1.005–1.030)
Urobilinogen, UA: 0.2 mg/dL (ref 0.0–1.0)

## 2016-06-03 ENCOUNTER — Ambulatory Visit (HOSPITAL_COMMUNITY)
Admission: RE | Admit: 2016-06-03 | Discharge: 2016-06-03 | Disposition: A | Discharge status: Home / Self Care | Payer: Self-pay | Source: Ambulatory Visit | Attending: Nurse Practitioner | Admitting: Nurse Practitioner

## 2016-06-03 ENCOUNTER — Encounter (HOSPITAL_COMMUNITY): Payer: Self-pay

## 2016-06-03 ENCOUNTER — Other Ambulatory Visit (HOSPITAL_COMMUNITY): Payer: Self-pay | Admitting: Nurse Practitioner

## 2016-06-03 DIAGNOSIS — O09522 Supervision of elderly multigravida, second trimester: Secondary | ICD-10-CM

## 2016-06-03 DIAGNOSIS — O09212 Supervision of pregnancy with history of pre-term labor, second trimester: Secondary | ICD-10-CM

## 2016-06-03 DIAGNOSIS — Z3A18 18 weeks gestation of pregnancy: Secondary | ICD-10-CM | POA: Insufficient documentation

## 2016-06-03 DIAGNOSIS — O24112 Pre-existing diabetes mellitus, type 2, in pregnancy, second trimester: Secondary | ICD-10-CM | POA: Insufficient documentation

## 2016-06-03 DIAGNOSIS — O99212 Obesity complicating pregnancy, second trimester: Secondary | ICD-10-CM

## 2016-06-03 DIAGNOSIS — O09892 Supervision of other high risk pregnancies, second trimester: Secondary | ICD-10-CM

## 2016-06-03 DIAGNOSIS — Z3687 Encounter for antenatal screening for uncertain dates: Secondary | ICD-10-CM

## 2016-06-03 DIAGNOSIS — O24410 Gestational diabetes mellitus in pregnancy, diet controlled: Secondary | ICD-10-CM

## 2016-06-03 DIAGNOSIS — O0992 Supervision of high risk pregnancy, unspecified, second trimester: Secondary | ICD-10-CM

## 2016-06-03 DIAGNOSIS — Z36 Encounter for antenatal screening of mother: Secondary | ICD-10-CM | POA: Insufficient documentation

## 2016-06-03 DIAGNOSIS — Z3689 Encounter for other specified antenatal screening: Secondary | ICD-10-CM

## 2016-06-03 LAB — PROTEIN, URINE, 24 HOUR
PROTEIN 24H UR: 174 mg/(24.h) — AB (ref <150)
PROTEIN, URINE: 29 mg/dL — AB (ref 5–24)

## 2016-06-03 NOTE — Progress Notes (Signed)
Genetic Counseling  High-Risk Gestation Note  Appointment Date:  06/03/2016 Referred By: Alberteen Spindle, NP Date of Birth:  [birth year]   Pregnancy History: [id number] Estimated Date of Delivery: 10/31/16 Estimated Gestational Age: [redacted]w[redacted]d Attending: Eulis Foster, MD   Ms. Cheryl Haynes and her husband were seen for genetic counseling because of a maternal age of 40 y.o.. English/Spanish interpreter, Mariel, provided interpretation for today's visit.      In summary:  Discussed AMA and associated risk for fetal aneuploidy  Discussed options for screening  Quad screen- declined  NIPS - declined  Ultrasound - performed today, complete results reported separately  Discussed diagnostic testing options  Amniocentesis- declined  Reviewed family history concerns  They were counseled regarding maternal age and the association with risk for chromosome conditions due to nondisjunction with aging of the ova.   We reviewed chromosomes, nondisjunction, and the associated 1 in 52 risk for fetal aneuploidy related to a maternal age of 40 y.o. at [redacted]w[redacted]d gestation.  They were counseled that the risk for aneuploidy decreases as gestational age increases, accounting for those pregnancies which spontaneously abort.  We specifically discussed Down syndrome (trisomy 76), trisomies 51 and 4, and sex chromosome aneuploidies (47,XXX and 47,XXY) including the common features and prognoses of each.   We reviewed available screening options including Quad screen, noninvasive prenatal screening (NIPS)/cell free DNA (cfDNA) screening, and detailed ultrasound.  They were counseled that screening tests are used to modify a patient's a priori risk for aneuploidy, typically based on age. This estimate provides a pregnancy specific risk assessment. We reviewed the benefits and limitations of each option. Specifically, we discussed the conditions for which each test screens, the detection rates, and false  positive rates of each. They were also counseled regarding diagnostic testing via amniocentesis. We reviewed the approximate 1 in 300-500 risk for complications from amniocentesis, including spontaneous pregnancy loss. We discussed the possible results that the tests might provide including: positive, negative, unanticipated, and no result. Finally, they were counseled regarding the cost of each option and potential out of pocket expenses.   A detailed ultrasound was performed today. The ultrasound report will be sent under separate cover. There were no visualized fetal anomalies or markers suggestive of aneuploidy. After consideration of all the options, they declined NIPS, Quad screen, and amniocentesis. They stated they were comfortable with the risk assessment for maternal age and ultrasound. They understand that screening tests cannot rule out all birth defects or genetic syndromes. The patient was advised of this limitation and states she still does not want additional testing at this time.    Ms. Cheryl Haynes was provided with written information regarding cystic fibrosis (CF), spinal muscular atrophy (SMA) and hemoglobinopathies including the carrier frequency, availability of carrier screening and prenatal diagnosis if indicated.  In addition, we discussed that CF and hemoglobinopathies are routinely screened for as part of the [place] newborn screening panel. CF carrier screening was previously performed and was within normal range. She declined additional carrier screening at this time.  Both family histories were reviewed and found to be noncontributory for birth defects, intellectual disability, and known genetic conditions. Without further information regarding the provided family history, an accurate genetic risk cannot be calculated. Further genetic counseling is warranted if more information is obtained.  Ms. Cheryl Haynes denied exposure to environmental toxins or chemical  agents. She denied the use of alcohol, tobacco or street drugs. She denied significant viral illnesses during the course of her pregnancy. Her medical and  surgical histories were contributory for gestational diabetes.   I counseled this couple regarding the above risks and available options.  The approximate face-to-face time with the genetic counselor was 45 minutes.  Quinn Plowman, MS,  Certified [employer] 06/03/2016

## 2016-06-03 NOTE — ED Notes (Signed)
Pt is here today with Husband and Interp from [place].  17 & 39yo daughters are in waiting room.  Pt educated about children policy and verbalized understanding for future appointments.  40 yo is staying with sibling but would not be able to leave 40yo unattended during visit.

## 2016-06-07 ENCOUNTER — Ambulatory Visit (INDEPENDENT_AMBULATORY_CARE_PROVIDER_SITE_OTHER): Payer: Self-pay | Admitting: Obstetrics and Gynecology

## 2016-06-07 ENCOUNTER — Encounter: Payer: Self-pay | Admitting: *Deleted

## 2016-06-07 ENCOUNTER — Encounter: Payer: Self-pay | Admitting: Obstetrics and Gynecology

## 2016-06-07 VITALS — BP 112/58 | HR 89 | Wt 170.0 lb

## 2016-06-07 DIAGNOSIS — Z789 Other specified health status: Secondary | ICD-10-CM

## 2016-06-07 DIAGNOSIS — O24419 Gestational diabetes mellitus in pregnancy, unspecified control: Secondary | ICD-10-CM

## 2016-06-07 DIAGNOSIS — E669 Obesity, unspecified: Secondary | ICD-10-CM

## 2016-06-07 DIAGNOSIS — Z603 Acculturation difficulty: Secondary | ICD-10-CM | POA: Insufficient documentation

## 2016-06-07 DIAGNOSIS — O09522 Supervision of elderly multigravida, second trimester: Secondary | ICD-10-CM

## 2016-06-07 DIAGNOSIS — Z6838 Body mass index (BMI) 38.0-38.9, adult: Secondary | ICD-10-CM | POA: Insufficient documentation

## 2016-06-07 DIAGNOSIS — O99212 Obesity complicating pregnancy, second trimester: Secondary | ICD-10-CM

## 2016-06-07 DIAGNOSIS — O09212 Supervision of pregnancy with history of pre-term labor, second trimester: Secondary | ICD-10-CM

## 2016-06-07 DIAGNOSIS — O09892 Supervision of other high risk pregnancies, second trimester: Secondary | ICD-10-CM

## 2016-06-07 DIAGNOSIS — O0992 Supervision of high risk pregnancy, unspecified, second trimester: Secondary | ICD-10-CM

## 2016-06-07 LAB — POCT URINALYSIS DIP (DEVICE)
GLUCOSE, UA: NEGATIVE mg/dL
Ketones, ur: 40 mg/dL — AB
NITRITE: NEGATIVE
Protein, ur: 30 mg/dL — AB
SPECIFIC GRAVITY, URINE: 1.025 (ref 1.005–1.030)
Urobilinogen, UA: 0.2 mg/dL (ref 0.0–1.0)
pH: 5.5 (ref 5.0–8.0)

## 2016-06-07 NOTE — Progress Notes (Signed)
Urine small amt wbcs, trace hgb

## 2016-06-07 NOTE — Progress Notes (Signed)
Prenatal Visit Note Date: 06/07/2016 Clinic: [facility]  Subjective:  Cheryl Haynes is a 40 y.o. [id number] at [redacted]w[redacted]d being seen today for ongoing prenatal care.  She is currently monitored for the following issues for this high-risk pregnancy and has Supervision of high risk pregnancy, antepartum; Gestational diabetes mellitus (GDM), antepartum; History of preterm delivery, currently pregnant; Obesity affecting pregnancy, antepartum; AMA (advanced maternal age) multigravida 35+; and BMI 38.0-38.9,adult on her problem list.  Patient reports no complaints.   Contractions: Not present. Vag. Bleeding: None.  Movement: Present. Denies leaking of fluid.   The following portions of the patient's history were reviewed and updated as appropriate: allergies, current medications, past family history, past medical history, past social history, past surgical history and problem list. Problem list updated.  Objective:   Vitals:   06/07/16 1049  BP: (!) 112/58  Pulse: 89  Weight: 170 lb (77.1 kg)    Fetal Status:     Movement: Present     General:  Alert, oriented and cooperative. Patient is in no acute distress.  Skin: Skin is warm and dry. No rash noted.   Cardiovascular: Normal heart rate noted  Respiratory: Normal respiratory effort, no problems with respiration noted  Abdomen: Soft, gravid, appropriate for gestational age. Pain/Pressure: Absent     Pelvic:  Cervical exam deferred        Extremities: Normal range of motion.  Edema: None  Mental Status: Normal mood and affect. Normal behavior. Normal judgment and thought content.   Urinalysis: Urine Protein: 1+ Urine Glucose: Negative  Assessment and Plan:  Pregnancy: [id number] at [redacted]w[redacted]d  1. Supervision of high risk pregnancy, antepartum, second trimester Routine care. Declined genetic screening/testing. S/p normal anatomy u/s  2. Gestational diabetes mellitus (GDM), antepartum, gestational diabetes method of  control unspecified Log book improved. Maggie (DM teaching, nutrition) booked up today so request for Maggie visit sometime in the next 7d and will have pt come back for 10-14d f/u provider visit -continue baby ASA -[facility] fetal echo already schedule for mid to late august -MFM recs: q4w growth scans, 32wk AP testing. U/s already scheduled by MFM From 7/24-27: 90s-135//90s-120//90s-147//90s-134 7/28 102//113//91//125 7/29  95//113//118//93 7/30 94//124//86//107 7/31  88  3. History of preterm delivery, currently pregnant, second trimester Declined 17p. Normal TAUS cx length at anatomy scan  4. Obesity affecting pregnancy, antepartum, second trimester See above  5. AMA (advanced maternal age) multigravida 35+, second trimester See above  Preterm labor symptoms and general obstetric precautions including but not limited to vaginal bleeding, contractions, leaking of fluid and fetal movement were reviewed in detail with the patient. Please refer to After Visit Summary for other counseling recommendations.  RTC: as above   [place] Bing, MD

## 2016-06-08 ENCOUNTER — Encounter: Payer: Self-pay | Admitting: *Deleted

## 2016-06-08 DIAGNOSIS — O24415 Gestational diabetes mellitus in pregnancy, controlled by oral hypoglycemic drugs: Secondary | ICD-10-CM

## 2016-06-08 DIAGNOSIS — O09892 Supervision of other high risk pregnancies, second trimester: Secondary | ICD-10-CM

## 2016-06-08 DIAGNOSIS — O09212 Supervision of pregnancy with history of pre-term labor, second trimester: Secondary | ICD-10-CM

## 2016-06-08 DIAGNOSIS — O99212 Obesity complicating pregnancy, second trimester: Secondary | ICD-10-CM

## 2016-06-08 DIAGNOSIS — O0992 Supervision of high risk pregnancy, unspecified, second trimester: Secondary | ICD-10-CM

## 2016-06-08 DIAGNOSIS — O09522 Supervision of elderly multigravida, second trimester: Secondary | ICD-10-CM

## 2016-06-08 LAB — GLUCOSE TOLERANCE, 3 HOURS
GLUCOSE 1 HOUR GTT: 209 mg/dL — AB (ref ?–200)
GLUCOSE 2 HOUR GTT: 189 mg/dL — AB (ref ?–140)
GLUCOSE 3 HOUR GTT: 105 mg/dL (ref ?–140)
Glucose, GTT - Fasting: 105 mg/dL (ref 80–110)

## 2016-06-14 ENCOUNTER — Ambulatory Visit: Payer: Self-pay | Admitting: *Deleted

## 2016-06-14 ENCOUNTER — Encounter: Payer: Self-pay | Attending: Obstetrics and Gynecology | Admitting: Dietician

## 2016-06-14 DIAGNOSIS — O24410 Gestational diabetes mellitus in pregnancy, diet controlled: Secondary | ICD-10-CM | POA: Insufficient documentation

## 2016-06-14 DIAGNOSIS — O24419 Gestational diabetes mellitus in pregnancy, unspecified control: Secondary | ICD-10-CM

## 2016-06-14 DIAGNOSIS — Z713 Dietary counseling and surveillance: Secondary | ICD-10-CM | POA: Insufficient documentation

## 2016-06-14 NOTE — Progress Notes (Signed)
Diabetes Education: 06/14/16 Cheryl Haynes was seen in Maternal/Fetal clinic and MD in clinic on reviewing her blood glucose levels, instructed her to see me as soon as possible. Assisted in the interview by Okey Regalarol our Spanish interpreter. Glucose levels: Fasting:135,125,132,94,102,95,94,88,94,82,99,98,100, 89,  Post-breakfast:120,115,108,95,113,113,124,101,100,101,96,107,100,105.  Post-Lunch:120,147,116,97,91,118,86,112,103,109,102,102,100,114.  Post-Dinner:134,100,96,125,93,107,98,91,114,107.95. 119, General, post-meal numbers are within the normal.  . Fasting levels are too often elevated. Diet recall reveals, that she is having 1%  milk and a slice if bread as a bedtime snack.   Instructed to have a serving of protein with the milk and bread.  Consider a walk in the early evening or late afternoon when the temperatures are cooler. Ask to f/u with me at next clinic appointment. Had purchased strips over the weekend when she ran out.  Provided 1 box strips LOT:  [id number] EXP:2019/0403 Provided 1 box lancets LOT: [id number] NM EXP: 2020/07/13. Maggie Marjo Grosvenor, RN, RD, LDN

## 2016-06-21 ENCOUNTER — Ambulatory Visit (INDEPENDENT_AMBULATORY_CARE_PROVIDER_SITE_OTHER): Payer: Self-pay | Admitting: Family Medicine

## 2016-06-21 VITALS — BP 110/56 | HR 77 | Wt 166.3 lb

## 2016-06-21 DIAGNOSIS — O09212 Supervision of pregnancy with history of pre-term labor, second trimester: Secondary | ICD-10-CM

## 2016-06-21 DIAGNOSIS — O0992 Supervision of high risk pregnancy, unspecified, second trimester: Secondary | ICD-10-CM

## 2016-06-21 DIAGNOSIS — O09892 Supervision of other high risk pregnancies, second trimester: Secondary | ICD-10-CM

## 2016-06-21 DIAGNOSIS — O24410 Gestational diabetes mellitus in pregnancy, diet controlled: Secondary | ICD-10-CM

## 2016-06-21 DIAGNOSIS — O09522 Supervision of elderly multigravida, second trimester: Secondary | ICD-10-CM

## 2016-06-21 DIAGNOSIS — E669 Obesity, unspecified: Secondary | ICD-10-CM

## 2016-06-21 DIAGNOSIS — O99212 Obesity complicating pregnancy, second trimester: Secondary | ICD-10-CM

## 2016-06-21 LAB — POCT URINALYSIS DIP (DEVICE)
Bilirubin Urine: NEGATIVE
Glucose, UA: NEGATIVE mg/dL
HGB URINE DIPSTICK: NEGATIVE
Ketones, ur: NEGATIVE mg/dL
NITRITE: NEGATIVE
PROTEIN: NEGATIVE mg/dL
SPECIFIC GRAVITY, URINE: 1.02 (ref 1.005–1.030)
UROBILINOGEN UA: 0.2 mg/dL (ref 0.0–1.0)
pH: 7 (ref 5.0–8.0)

## 2016-06-21 NOTE — Patient Instructions (Signed)
Diabetes mellitus gestacional (Gestational Diabetes Mellitus) La diabetes mellitus gestacional, ms comnmente conocida como diabetes gestacional es un tipo de diabetes que desarrollan algunas mujeres durante el embarazo. En la diabetes gestacional, el pncreas no produce suficiente insulina (una hormona) o las clulas son menos sensibles a la insulina producida (resistencia a la insulina), o ambas cosas. Normalmente, la insulina mueve los azcares de los alimentos a las clulas de los tejidos. Las clulas de los tejidos utilizan los azcares para obtener energa. La falta de insulina o la falta de una respuesta normal a la insulina hace que el exceso de azcar se acumule en la sangre en lugar de penetrar en las clulas de los tejidos. Como resultado, se producen niveles altos de azcar en la sangre (hiperglucemia). El efecto de los niveles altos de azcar (glucosa) puede causar muchos problemas.  FACTORES DE RIESGO Usted tiene mayor probabilidad de desarrollar diabetes gestacional si tiene antecedentes familiares de diabetes y tambin si tiene uno o ms de los siguientes factores de riesgo:  ndice de masa corporal superior a 30 (obesidad).  Embarazo previo con diabetes gestacional.  La edad avanzada en el momento del embarazo. Si se mantienen los niveles de glucosa en la sangre en un rango normal durante el embarazo, las mujeres pueden tener un embarazo saludable. Si los niveles de glucosa en la sangre no estn bien controlados, puede haber riesgos para usted, el feto o el recin nacido, o durante el trabajo de parto y el parto.  SNTOMAS  Si se presentan sntomas, stos son similares a los sntomas que normalmente experimentar durante el embarazo. Los sntomas de la diabetes gestacional son:   Aumento de la sed (polidipsia).  Aumento de la miccin (poliuria).  Orina con ms frecuencia durante la noche (nocturia).  Prdida de peso. La prdida de peso puede ser muy rpida.  Infecciones  frecuentes y recurrentes.  Cansancio (fatiga).  Debilidad.  Cambios en la visin, como visin borrosa.  Olor a fruta en el aliento.  Dolor abdominal. DIAGNSTICO La diabetes se diagnostica cuando hay aumento de los niveles de glucosa en la sangre. El nivel de glucosa en la sangre puede controlarse en uno o ms de los siguientes anlisis de sangre:  Medicin de glucosa en la sangre en ayunas. No se le permitir comer durante al menos 8 horas antes de que se tome una muestra de sangre.  Pruebas al azar de glucosa en la sangre. El nivel de glucosa en la sangre se controla en cualquier momento del da sin importar el momento en que haya comido.  Prueba de tolerancia a la glucosa oral (PTGO). La glucosa en la sangre se mide despus de no haber comido (ayunas) durante una a tres horas y despus de beber una bebida que contenga glucosa. Dado que las hormonas que causan la resistencia a la insulina son ms altas alrededor de las semanas 24 a 28 de embarazo, generalmente se realiza una PTGO durante ese tiempo. Si tiene factores de riesgo, en la primera visita prenatal pueden hacerle pruebas de deteccin de diabetes tipo 2 no diagnosticada. TRATAMIENTO  La diabetes gestacional debe controlarse en primer lugar con dieta y ejercicios. Pueden agregarse medicamentos, pero solo si son necesarios.  Usted tendr que tomar medicamentos para la diabetes o insulina diariamente para mantener los niveles de glucosa en la sangre en el rango deseado.  Usted tendr que combinar la dosis de insulina con la actividad fsica y la eleccin de alimentos saludables. Si tiene diabetes gestacional, el objetivo del tratamiento ser   mantener los siguientes niveles sanguneos de glucosa:  Antes de las comidas (preprandial): valor de 95 mg/dl o inferior.  Despus de las comidas (posprandial):  Una hora despus de la comida: valor de 140 mg/dl o inferior.  Dos horas despus de la comida: valor de 120 mg/dl o  inferior. Si tiene diabetes tipo 1 o tipo 2 preexistente, el objetivo del tratamiento ser mantener los siguientes niveles sanguneos de glucosa:  Antes de las comidas, a la hora de acostarse y durante la noche: de 60 a 99 mg/dl.  Despus de las comidas: valor mximo de 100 a 129 mg/dl. INSTRUCCIONES PARA EL CUIDADO EN EL HOGAR   Controle su nivel de hemoglobina A1c dos veces al ao.  Contrlese a diario el nivel de glucosa en la sangre segn las indicaciones de su mdico. Es comn realizar controles frecuentes de la glucosa en la sangre.  Supervise las cetonas en la orina cuando est enferma y segn las indicaciones de su mdico.  Tome el medicamento para la diabetes y adminstrese insulina segn las indicaciones de su mdico para mantener el nivel de glucosa en la sangre en el rango deseado.  Nunca se quede sin medicamento para la diabetes o sin insulina. Es necesario que la reciba todos los das.  Ajuste la insulina segn la ingesta de hidratos de carbono. Los hidratos de carbono pueden aumentar los niveles de glucosa en la sangre, pero deben incluirse en su dieta. Los hidratos de carbono aportan vitaminas, minerales y fibra que son una parte esencial de una dieta saludable. Los hidratos de carbono se encuentran en frutas, verduras, cereales integrales, productos lcteos, legumbres y alimentos que contienen azcares aadidos.  Consuma alimentos saludables. Alterne 3 comidas con 3 colaciones.  Aumente de peso saludablemente. El aumento del peso total vara de acuerdo con el ndice de masa corporal que tena antes del embarazo ([facility]).  Lleve una tarjeta de alerta mdica o use una pulsera o medalla de alerta mdica.  Lleve con usted una colacin de 15gramos de hidratos de carbono en todo momento para controlar los niveles bajos de glucosa en la sangre (hipoglucemia). Algunos ejemplos de colaciones de 15gramos de hidratos de carbono son los siguientes:  Tabletas de glucosa, 3 o 4.  Gel  de glucosa, tubo de 15 gramos.  Pasas de uva, 2 cucharadas (24 g).  Caramelos de goma, 6.  Galletas de animales, 8.  Jugo de fruta, gaseosa comn, o leche descremada, 4 onzas (120 ml).  Pastillas de goma, 9.  Reconocer la hipoglucemia. Durante el embarazo la hipoglucemia se produce cuando hay niveles de glucosa en la sangre de 60 mg/dl o menos. El riesgo de hipoglucemia aumenta durante el ayuno o cuando se saltea las comidas, durante o despus de realizar ejercicio intenso y mientras duerme. Los sntomas de hipoglucemia son:  Temblores o sacudidas.  Disminucin de la capacidad de concentracin.  Sudoracin.  Aumento de la frecuencia cardaca.  Dolor de cabeza.  Sequedad en la boca.  Hambre.  Irritabilidad.  Ansiedad.  Sueo agitado.  Alteracin del habla o de la coordinacin.  Confusin.  Tratar la hipoglucemia rpidamente. Si usted est alerta y puede tragar con seguridad, siga la regla de 15/15 que consiste en:  Tome entre 15 y 20gramos de glucosa de accin rpida o carbohidratos. Las opciones de accin rpida son un gel de glucosa, tabletas de glucosa, o 4 onzas (120 ml) de jugo de frutas, gaseosa comn, o leche baja en grasa.  Compruebe su nivel de glucosa en la sangre   15 minutos despus de tomar la glucosa.  Tome entre 15 y 20 gramos ms de glucosa si el nivel de glucosa en la sangre todava es de 70mg/dl o inferior.  Ingiera una comida o una colacin en el lapso de 1 hora una vez que los niveles de glucosa en la sangre vuelven a la normalidad.  Est atento a la poliuria (miccin excesiva) y la polidipsia (sensacin de mucha sed), que son los primeros signos de la hiperglucemia. El reconocimiento temprano de la hiperglucemia permite un tratamiento oportuno. Trate la hiperglucemia segn le indic su mdico.  Haga actividad fsica por lo menos 30minutos al da o como lo indique su mdico. Se recomienda que 30 minutos despus de cada comida, realice diez minutos  de actividad fsica para controlar los niveles de glucosa postprandial en la sangre.  Ajuste su dosis de insulina y la ingesta de alimentos, segn sea necesario, si inicia un nuevo ejercicio o deporte.  Siga su plan para los das de enfermedad cuando no pueda comer o beber como de costumbre.  Evite el tabaco y el alcohol.  Concurra a todas las visitas de control como se lo haya indicado el mdico.  Siga el consejo del mdico respecto a los controles prenatales y posteriores al parto (postparto), las visitas, la planificacin de las comidas, el ejercicio, los medicamentos, las vitaminas, los anlisis de sangre, otras pruebas mdicas y actividades fsicas.  Realice diariamente el cuidado de la piel y de los pies. Examine su piel y los pies diariamente para ver si tiene cortes, moretones, enrojecimiento, problemas en las uas, sangrado, ampollas o llagas.  Cepllese los dientes y encas por lo menos dos veces al da y use hilo dental al menos una vez por da. Concurra regularmente a las visitas de control con el dentista.  Programe un examen de vista durante el primer trimestre de su embarazo o como lo indique su mdico.  Comparta su plan de control de diabetes en el trabajo o en la escuela.  Mantngase al da con las vacunas.  Aprenda a manejar el estrs.  Obtenga la mayor cantidad posible de informacin sobre la diabetes y solicite ayuda siempre que sea necesario.  Obtenga informacin sobre el amamantamiento y analice esta posibilidad.  Debe controlar el nivel de azcar en la sangre de 6a 12semanas despus del parto. Esto se hace con una prueba de tolerancia a la glucosa oral (PTGO). SOLICITE ATENCIN MDICA SI:   No puede comer alimentos o beber por ms de 6 horas.  Tuvo nuseas o ha vomitado durante ms de 6 horas.  Tiene un nivel de glucosa en la sangre de 200 mg/dl y cetonas en la orina.  Presenta algn cambio en el estado mental.  Desarrolla problemas de visin.  Sufre  un dolor persistente de cabeza.  Siente dolor o molestias en la parte superior del abdomen.  Desarrolla una enfermedad grave adicional.  Tuvo diarrea durante ms de 6 horas.  Ha estado enfermo o ha tenido fiebre durante un par de das y no mejora. SOLICITE ATENCIN MDICA DE INMEDIATO SI:   Tiene dificultad para respirar.  Ya no siente los movimientos del beb.  Est sangrando o tiene flujo vaginal.  Comienza a tener contracciones o trabajo de parto prematuro. ASEGRESE DE QUE:  Comprende estas instrucciones.  Controlar su afeccin.  Recibir ayuda de inmediato si no mejora o si empeora.   Esta informacin no tiene como fin reemplazar el consejo del mdico. Asegrese de hacerle al mdico cualquier pregunta que tenga.     Document Released: 08/04/2005 Document Revised: 11/15/2014 Elsevier Interactive Patient Education 2016 [employer]  SegundoSegundo trimestre de Psychiatristembarazo (Second Trimester of Pregnancy) El segundo trimestre va desde la semana13 hasta la 28, desde el cuarto hasta el sexto mes, y suele ser el momento en el que mejor se siente. Su organismo se ha adaptado a Charity fundraiserestar embarazada y comienza a Diplomatic Services operational officersentirse fsicamente mejor. En general, las nuseas matutinas han disminuido o han desaparecido completamente, puede tener ms energa y un aumento de apetito. El segundo trimestre es tambin la poca en la que el feto se desarrolla rpidamente. Hacia el final del sexto mes, el feto mide aproximadamente 9pulgadas (23cm) y pesa alrededor de 1 libras (700g). Es probable que sienta que el beb se Teacher, English as a foreign languagemueve (da pataditas) entre las 18 y 20semanas del Psychiatristembarazo. CAMBIOS EN EL ORGANISMO Su organismo atraviesa por muchos cambios durante el [place], y estos varan de Neomia Dearuna mujer a Educational psychologistotra.  Seguir American Standard Companiesaumentando de peso. Notar que la parte baja del abdomen sobresale. Podrn aparecer las primeras Albertson'sestras en las caderas, el abdomen y las Shastamamas. Es posible que tenga dolores de cabeza que pueden aliviarse  con los medicamentos que el mdico le permita tomar. Tal vez tenga necesidad de orinar con ms frecuencia porque el feto est ejerciendo presin Ambulance personsobre la vejiga. Debido al Vanetta Muldersembarazo podr sentir Anthoney Haradaacidez estomacal con frecuencia. Puede estar estreida, ya que ciertas hormonas enlentecen los movimientos de los msculos que New York Life Insuranceempujan los desechos a travs de los intestinos. Pueden aparecer hemorroides o abultarse e hincharse las venas (venas varicosas). Puede tener dolor de espalda que se debe al Citigroupaumento de peso y a que las hormonas del Management consultantembarazo relajan las articulaciones entre los huesos de la pelvis, y Public librariancomo consecuencia de la modificacin del peso y los msculos que mantienen el equilibrio. Las ConAgra Foodsmamas seguirn creciendo y Development worker, communityle dolern. Las Veterinary surgeonencas pueden sangrar y estar sensibles al cepillado y al hilo dental. Pueden aparecer zonas oscuras o manchas (cloasma, mscara del Psychiatristembarazo) en el rostro que probablemente se atenuar despus del nacimiento del beb. Es posible que se forme una lnea oscura desde el ombligo hasta la zona del pubis (linea nigra) que probablemente se atenuar despus del nacimiento del beb. Tal vez haya cambios en el cabello que pueden incluir su engrosamiento, crecimiento rpido y cambios en la textura. Adems, a algunas mujeres se les cae el cabello durante o despus del embarazo, o tienen el cabello seco o fino. Lo ms probable es que el cabello se le normalice despus del nacimiento del beb. QU DEBE ESPERAR EN LAS CONSULTAS PRENATALES Durante una visita prenatal de rutina: La pesarn para asegurarse de que usted y el feto estn creciendo normalmente. Le tomarn la presin arterial. Le medirn el abdomen para controlar el desarrollo del beb. Se escucharn los latidos cardacos fetales. Se evaluarn los resultados de los estudios solicitados en visitas anteriores. El mdico puede preguntarle lo siguiente: Cmo se siente. Si siente los movimientos del beb. Si ha tenido sntomas  anormales, como prdida de lquido, Kaanapalisangrado, dolores de cabeza intensos o clicos abdominales. Si est consumiendo algn producto que contenga tabaco, como cigarrillos, tabaco de Theatre managermascar y Administrator, Civil Servicecigarrillos electrnicos. Si tiene Colgate-Palmolivealguna pregunta. Otros estudios que podrn realizarse durante el segundo trimestre incluyen lo siguiente: Anlisis de sangre para detectar lo siguiente: Concentraciones de hierro bajas (anemia). Diabetes gestacional (entre la semana 24 y la 28). Anticuerpos Rh. Anlisis de orina para detectar infecciones, diabetes o protenas en la orina. Una ecografa para confirmar que el beb crece y se desarrolla correctamente.  Una amniocentesis para diagnosticar posibles problemas genticos. Estudios del feto para descartar espina bfida y sndrome de Down. Prueba del VIH (virus de inmunodeficiencia humana). Los exmenes prenatales de rutina incluyen la prueba de deteccin del VIH, a menos que decida no Futures traderrealizrsela. INSTRUCCIONES PARA EL CUIDADO EN EL HOGAR  Evite fumar, consumir hierbas, beber alcohol y tomar frmacos que no le hayan recetado. Estas sustancias qumicas afectan la formacin y el desarrollo del beb. No consuma ningn producto que contenga tabaco, lo que incluye cigarrillos, tabaco de Theatre managermascar y Administrator, Civil Servicecigarrillos electrnicos. Si necesita ayuda para dejar de fumar, consulte al American Expressmdico. Puede recibir asesoramiento y otro tipo de recursos para dejar de fumar. Siga las indicaciones del mdico en relacin con el uso de medicamentos. Durante el embarazo, hay medicamentos que son seguros de tomar y otros que no. Haga ejercicio solamente como se lo haya indicado el mdico. Sentir clicos uterinos es un buen signo para Restaurant manager, fast fooddetener la actividad fsica. Contine comiendo alimentos sanos con regularidad. Use un sostn que le brinde buen soporte si le Altria Groupduelen las mamas. No se d baos de inmersin en agua caliente, baos turcos ni saunas. Use el cinturn de seguridad en todo momento mientras  conduce. No coma carne cruda ni queso sin cocinar; evite el contacto con las bandejas sanitarias de los gatos y la tierra que estos animales usan. Estos elementos contienen grmenes que pueden causar defectos congnitos en el beb. Tome las vitaminas prenatales. Tome entre 1500 y 2000mg  de calcio diariamente comenzando en la semana20 del embarazo [place]asta el parto. Si est estreida, pruebe un laxante suave (si el mdico lo autoriza). Consuma ms alimentos ricos en fibra, como vegetales y frutas frescos y Radiation protection practitionercereales integrales. Beba gran cantidad de lquido para mantener la orina de tono claro o color amarillo plido. Dese baos de asiento con agua tibia para Engineer, materialsaliviar el dolor o las molestias causadas por las hemorroides. Use una crema para las hemorroides si el mdico la autoriza. Si tiene venas varicosas, use medias de descanso. Eleve los pies durante 15minutos, 3 o 4veces por da. Limite el consumo de sal en su dieta. No levante objetos pesados, use zapatos de tacones bajos y [address] Boulevardmantenga una buena postura. Descanse con las piernas elevadas si tiene calambres o dolor de cintura. Visite a su dentista si an no lo ha Occupational hygienisthecho durante el embarazo. Use un cepillo de dientes blando para higienizarse los dientes y psese el hilo dental con suavidad. Puede seguir Calpine Corporationmanteniendo relaciones sexuales, a menos que el mdico le indique lo contrario. Concurra a todas las visitas prenatales segn las indicaciones de su mdico. SOLICITE ATENCIN MDICA SI:  Santa Generaiene mareos. Siente clicos leves, presin en la pelvis o dolor persistente en el abdomen. Tiene nuseas, vmitos o diarrea persistentes. Brett Fairybserva una secrecin vaginal con mal olor. Siente dolor al ConocoPhillipsorinar. SOLICITE ATENCIN MDICA DE INMEDIATO SI:  Tiene fiebre. Tiene una prdida de lquido por la vagina. Tiene sangrado o pequeas prdidas vaginales. Siente dolor intenso o clicos en el abdomen. Sube o baja de peso rpidamente. Tiene dificultad para respirar y  siente dolor de pecho. Sbitamente se le hinchan mucho el rostro, las Shellytownmanos, los tobillos, los pies o las piernas. No ha sentido los movimientos del beb durante Georgianne Fickuna hora. Siente un dolor de cabeza intenso que no se alivia con medicamentos. Su visin se modifica.   Esta informacin no tiene Theme park managercomo fin reemplazar el consejo del mdico. Asegrese de hacerle al mdico cualquier pregunta que tenga.   Document Released: 08/04/2005 Document Revised: 11/15/2014 Elsevier  Interactive Patient Education [employer].

## 2016-06-21 NOTE — Progress Notes (Signed)
Subjective:  Cheryl Haynes is a 40 y.o. [id number] at 6537w1d being seen today for ongoing prenatal care.  She is currently monitored for the following issues for this high-risk pregnancy and has Supervision of high risk pregnancy, antepartum; Gestational diabetes mellitus (GDM), antepartum; History of preterm delivery, currently pregnant; Obesity affecting pregnancy, antepartum; AMA (advanced maternal age) multigravida 35+; BMI 38.0-38.9,adult; and Language barrier on her problem list.  Patient reports weight loss of 4 lbs, mild nausea..  Contractions: Not present.  .  Movement: Present. Denies leaking of fluid.   The following portions of the patient's history were reviewed and updated as appropriate: allergies, current medications, past family history, past medical history, past social history, past surgical history and problem list. Problem list updated.  Objective:   Vitals:   06/21/16 0819  BP: (!) 110/56  Pulse: 77  Weight: 166 lb 4.8 oz (75.4 kg)    Fetal Status: Fetal Heart Rate (bpm): 134   Movement: Present     General:  Alert, oriented and cooperative. Patient is in no acute distress.  Skin: Skin is warm and dry. No rash noted.   Cardiovascular: Normal heart rate noted  Respiratory: Normal respiratory effort, no problems with respiration noted  Abdomen: Soft, gravid, appropriate for gestational age. Pain/Pressure: Present     Pelvic:  Cervical exam deferred        Extremities: Normal range of motion.  Edema: None  Mental Status: Normal mood and affect. Normal behavior. Normal judgment and thought content.   Urinalysis:      Assessment and Plan:  Pregnancy: [id number] at 5937w1d  1. Supervision of high risk pregnancy, antepartum, second trimester  2. Diet controlled gestational diabetes mellitus (GDM), antepartum FBS:  94/82/99/89/100/89/123/111/107/111/112/107/118/120 2 hr Breakfast: 100/101/96/107/100/105/100/126/115/131/118/108/109 2 hr Lunch:  103/109/102/102/100/114/110/112/132/120/100/122/104 2 hr Dinner: 91/114/112/107/95/119/137/120/134/119/123/112/110 - Was doing after dinner snacks with fruits, cookies. Discussed appropriate foods to eat for snacks, more high protein or vegetables than sugar/carb-rich foods. - Next US with MFM 8/23 at 10:30 AM; Fetal ECHO at 8/24 at 9 AM. - Patient working on diet changes, will bring in log at next visit and revisit if medication will be needed.  3. History of preterm delivery, currently pregnant, second trimester   4. Obesity affecting pregnancy, antepartum, second trimester - No weight gain  5. AMA (advanced maternal age) multigravida 35+, second trimester  Preterm labor symptoms and general obstetric precautions including but not limited to vaginal bleeding, contractions, leaking of fluid and fetal movement were reviewed in detail with the patient. Please refer to After Visit Summary for other counseling recommendations.  Return in about 4 weeks (around 07/19/2016) for Routine OB visit.   [facility] CenterElizabeth Woodland DeemstonMum[place], [place]

## 2016-06-21 NOTE — Progress Notes (Signed)
Subjective:   Cheryl Haynes is a 40 y.o. [id number] at 55w1dbeing seen today for ongoing prenatal care.  She is currently monitored for the following issues for this high-risk pregnancy and has Supervision of high risk pregnancy, antepartum; Gestational diabetes mellitus (GDM), antepartum; History of preterm delivery, currently pregnant; Obesity affecting pregnancy, antepartum; AMA (advanced maternal age) multigravida 35+; and BMI 38.0-38.9,adult on her problem list.  This is her first pregnancy with GDM, currently managed with diet and exercise (30 minutes of walking daily). She has been monitoring daily sugars. Met with DM educator 2 weeks ago. She has lost 4 pounds since last seen. Contractions: Not present. Vag. Bleeding: None. Movement: Present. Denies leaking of fluid.   Fasting: 88-123 // Postprandial breakfast: 96-126 // Postprandial lunch: 100 - 132 // Postprandial dinner: 95-137.  The following portions of the patient's history were reviewed and updated as appropriate: allergies, current medications, past family history, past medical history, past social history, past surgical history and problem list. Problem list updated.   Menstrual History: OB History    Gravida Para Term Preterm AB Living   _0 SAB TAB Ectopic Multiple Live Births   1 0 0 0 3      Patient's last menstrual period was 01/28/2016 (approximate).    Objective:    BP (!) 110/56   Pulse 77   Wt 166 lb 4.8 oz (75.4 kg)   LMP 01/28/2016 (Approximate)   BMI 35.36 kg/m  FHT: Not performed.  Uterine Size: 21 cm     Assessment:    Pregnancy 21 and 1/7 weeks   Plan:   1. Supervision of high risk pregnancy, antepartum, second trimester Routine care. Declined genetic screening/testing. S/p normal anatomy u/s  2. Gestational diabetes mellitus (GDM), antepartum, gestational diabetes method of control unspecified -Discussed appropriate snacks, aiming for high protein and less  carbohydrates and sugars. She will continue log book and call if sugars over 150 prior to next visit. -continue baby ASA -[facility] fetal echo scheduled for 8/24. -MFM recs: q4w growth scans, 32wk AP testing. U/s scheduled in 1 week.  3. History of preterm delivery, currently pregnant, second trimester Previously declined 17p. Normal TAUS cx length at anatomy scan.  4. Obesity affecting pregnancy, antepartum, second trimester -Weight loss less concerning given prepregnancy obesity and increased exercise for diabetes management. No vomiting.  5. AMA (advanced maternal age) multigravida 324+ second trimester  Preterm labor symptoms and general obstetric precautions including but not limited to vaginal bleeding, contractions, leaking of fluid and fetal movement were reviewed in detail with the patient.  F/u in 4 weeks.

## 2016-06-30 ENCOUNTER — Encounter (HOSPITAL_COMMUNITY): Payer: Self-pay

## 2016-06-30 ENCOUNTER — Ambulatory Visit (HOSPITAL_COMMUNITY)
Admission: RE | Admit: 2016-06-30 | Discharge: 2016-06-30 | Disposition: A | Discharge status: Home / Self Care | Payer: Self-pay | Source: Ambulatory Visit | Attending: Nurse Practitioner | Admitting: Nurse Practitioner

## 2016-06-30 DIAGNOSIS — O24112 Pre-existing diabetes mellitus, type 2, in pregnancy, second trimester: Secondary | ICD-10-CM | POA: Insufficient documentation

## 2016-06-30 DIAGNOSIS — O09522 Supervision of elderly multigravida, second trimester: Secondary | ICD-10-CM | POA: Insufficient documentation

## 2016-07-01 ENCOUNTER — Ambulatory Visit (HOSPITAL_COMMUNITY): Payer: Self-pay

## 2016-07-02 ENCOUNTER — Encounter: Payer: Self-pay | Admitting: *Deleted

## 2016-07-05 ENCOUNTER — Encounter (HOSPITAL_COMMUNITY): Payer: Self-pay

## 2016-07-19 ENCOUNTER — Ambulatory Visit (INDEPENDENT_AMBULATORY_CARE_PROVIDER_SITE_OTHER): Payer: Self-pay | Admitting: Obstetrics & Gynecology

## 2016-07-19 VITALS — BP 104/58 | HR 69 | Wt 162.4 lb

## 2016-07-19 DIAGNOSIS — O0992 Supervision of high risk pregnancy, unspecified, second trimester: Secondary | ICD-10-CM

## 2016-07-19 DIAGNOSIS — Z23 Encounter for immunization: Secondary | ICD-10-CM

## 2016-07-19 LAB — POCT URINALYSIS DIP (DEVICE)
BILIRUBIN URINE: NEGATIVE
Glucose, UA: NEGATIVE mg/dL
Ketones, ur: NEGATIVE mg/dL
NITRITE: NEGATIVE
PH: 7 (ref 5.0–8.0)
PROTEIN: NEGATIVE mg/dL
Specific Gravity, Urine: 1.01 (ref 1.005–1.030)
Urobilinogen, UA: 0.2 mg/dL (ref 0.0–1.0)

## 2016-07-19 MED ORDER — FEXOFENADINE HCL 180 MG PO TABS: 180.0000 mg | ORAL_TABLET | Freq: Every day | ORAL | 6 refills | Status: DC

## 2016-07-19 NOTE — Progress Notes (Signed)
   PRENATAL VISIT NOTE  Subjective:  Cheryl Haynes is a 40 y.o. [id number] at 6126w1d being seen today for ongoing prenatal care.  She is currently monitored for the following issues for this high-risk pregnancy and has Supervision of high risk pregnancy, antepartum; Gestational diabetes mellitus (GDM), antepartum; History of preterm delivery, currently pregnant; Obesity affecting pregnancy, antepartum; AMA (advanced maternal age) multigravida 35+; BMI 38.0-38.9,adult; and Language barrier on her problem list.  Patient reports nasal stuffiness in morning.  Contractions: Not present. Vag. Bleeding: None.  Movement: Present. Denies leaking of fluid.   The following portions of the patient's history were reviewed and updated as appropriate: allergies, current medications, past family history, past medical history, past social history, past surgical history and problem list. Problem list updated.  Objective:   Vitals:   07/19/16 0755  Weight: 162 lb 6.4 oz (73.7 kg)    Fetal Status: Fetal Heart Rate (bpm): 136   Movement: Present     General:  Alert, oriented and cooperative. Patient is in no acute distress.  Skin: Skin is warm and dry. No rash noted.   Cardiovascular: Normal heart rate noted  Respiratory: Normal respiratory effort, no problems with respiration noted  Abdomen: Soft, gravid, appropriate for gestational age. Pain/Pressure: Absent     Pelvic:  Cervical exam deferred        Extremities: Normal range of motion.  Edema: None  Mental Status: Normal mood and affect. Normal behavior. Normal judgment and thought content.   Urinalysis:      Assessment and Plan:  Pregnancy: [id number] at 3326w1d  1. Supervision of high-risk pregnancy, second trimester - Flu Vaccine QUAD 36+ mos IM (Fluarix & Fluzone Quad PF -tdap and labs next visit  2. Supervision of high risk pregnancy, antepartum, second trimester -US with MFM September 20th -Continue Glyburide at night -all CBGs are well  controlled -as patient has eaten well and taken medications, she is losing weight.  Total weight gain is to be 12 pounds.  If still losing weight next visit, will send to nutritionist.  Preterm labor symptoms and general obstetric precautions including but not limited to vaginal bleeding, contractions, leaking of fluid and fetal movement were reviewed in detail with the patient. Please refer to After Visit Summary for other counseling recommendations.  Return in about 3 weeks (around 08/09/2016).  Lesly DukesKelly H Juvon Teater, MD

## 2016-07-19 NOTE — Progress Notes (Signed)
Spanish Interpreter BoeingBlanca Lander

## 2016-07-19 NOTE — Patient Instructions (Signed)
Eleccin del mtodo anticonceptivo (Contraception Choices) La anticoncepcin (control de la natalidad) es el uso de cualquier mtodo o dispositivo para evitar el embarazo. A continuacin se indican algunos de esos mtodos. MTODOS HORMONALES   El Implante contraconceptivo consiste en un tubo plstico delgado que contiene la hormona progesterona. No contiene estrgenos. El mdico inserta el tubo en la parte interna del brazo. El tubo puede permanecer en el lugar durante 3 aos. Despus de los 3 aos debe retirarse. El implante impide que los ovarios liberen vulos (ovulacin), espesa el moco cervical, lo que evita que los espermatozoides ingresen al tero y hace ms delgada la membrana que cubre el interior del tero.  Inyecciones de progesterona sola: las administra el mdico cada 3 meses para evitar el embarazo. La progesterona sinttica impide que los ovarios liberen vulos. Tambin hacen que el moco cervical se espese y modifique el tejido de recubrimiento interno del tero. Esto hace ms difcil que los espermatozoides sobrevivan en el tero.  Las pldoras anticonceptivas contienen estrgenos y progesterona. Su funcin es evitar que los ovarios liberen vulos (ovulacin). Las hormonas de los anticonceptivos orales hacen que el moco cervical se haga ms espeso, lo que evita que el esperma ingrese al tero. Las pldoras anticonceptivas son recetadas por el mdico.Tambin se utilizan para tratar los perodos menstruales abundantes.  Minipldora: este tipo de pldora anticonceptiva contiene slo hormona progesterona. Deben tomarse todos los das del mes y debe recetarlas el mdico.  El parche de control de natalidad: contiene hormonas similares a las que contienen las pldoras anticonceptivas. Deben cambiarse una vez por semana y se utilizan bajo prescripcin mdica.  Anillo vaginal: contiene hormonas similares a las que contienen las pldoras anticonceptivas. Se deja colocado durante tres semanas,  se lo retira durante 1 semana y luego se coloca uno nuevo. La paciente debe sentirse cmoda al insertar y retirar el anillo de la vagina.Es necesaria la prescripcin mdica.  Anticonceptivos de emergencia: son mtodos para evitar un embarazo despus de una relacin sexual sin proteccin. Esta pldora puede tomarse inmediatamente despus de tener relaciones sexuales o hasta 5 das de haber tenido sexo sin proteccin. Es ms efectiva si se toma poco tiempo despus de la relacin sexual. Los anticonceptivos de emergencia estn disponibles sin prescripcin mdica. Consltelo con su farmacutico. No use los anticonceptivos de emergencia como nico mtodo anticonceptivo. MTODOS DE BARRERA   Condn masculino: es una vaina delgada (ltex o goma) que se coloca cubriendo al pene durante el acto sexual. Puede usarse con espermicida para aumentar la efectividad.  Condn femenino. Es una funda delicada y blanda que se adapta holgadamente a la vagina antes de las relaciones sexuales.  Diafragma: es una barrera de ltex redonda y suave que debe ser recomendado por un profesional. Se inserta en la vagina, junto con un gel espermicida. Debe insertarse antes de tener relaciones sexuales. Debe dejar el diafragma colocado en la vagina durante 6 a 8 horas despus de la relacin sexual.  Capuchn cervical: es una barrera de ltex o taza plstica redonda y suave que cubre el cuello del tero y debe ser colocada por un mdico. Puede dejarlo colocado en la vagina hasta 48 horas despus de las relaciones sexuales.  Esponja: es una pieza blanda y circular de espuma de poliuretano. Contiene un espermicida. Se inserta en la vagina despus de mojarla y antes de las relaciones sexuales.  Espermicidas: son sustancias qumicas que matan o bloquean al esperma y no lo dejan ingresar al cuello del tero y al tero. Vienen   en forma de cremas, geles, supositorios, espuma o comprimidos. No es necesario tener receta mdica. Se insertan en  la vagina con un aplicador antes de tener relaciones sexuales. El proceso debe repetirse cada vez que tiene relaciones sexuales. ANTICONCEPTIVOS INTRAUTERINOS  Dispositivo intrauterino (DIU) es un dispositivo en forma de T que se coloca en el tero durante el perodo menstrual, para evitar el embarazo. Hay dos tipos:  DIU de cobre: este tipo de DIU est recubierto con un alambre de cobre y se inserta dentro del tero. El cobre hace que el tero y las trompas de Falopio produzcan un liquido que destruye los espermatozoides. Puede permanecer colocado durante 10 aos.  DIU con hormona: este tipo de DIU contiene la hormona progestina (progesterona sinttica). La hormona espesa el moco cervical y evita que los espermatozoides ingresen al tero y tambin afina la membrana que cubre el tero para evitar la implantacin del vulo fertilizado. La hormona debilita o destruye los espermatozoides que ingresan al tero. Puede permanecer en el lugar durante 3-5 aos, segn el tipo de DIU que se utilice. MTODOS ANTICONCEPTIVOS PERMANENTES  Ligadura de trompas en la mujer: se realiza sellando, atando u obstruyendo quirrgicamente las trompas de Falopio lo que impide que el vulo descienda hacia el tero.  Esterilizacin histeroscpica: Implica la colocacin de un pequeo espiral o la insercin en cada trompa de Falopio. El mdico utiliza una tcnica llamada histeroscopa para realizar este procedimiento. El dispositivo produce la formacin de tejido cicatrizal. Esto da como resultado una obstruccin permanente de las trompas de Falopio, de modo que la esperma no pueda fertilizar el vulo. Demora alrededor de 3 meses despus del procedimiento hasta que el conducto se obstruye. Tendr que usar otro mtodo anticonceptivo durante al menos 3 meses.  Esterilizacin masculina: se realiza ligando los conductos por los que pasan los espermatozoides (vasectoma).Esto impide que el esperma ingrese a la vagina durante el acto  sexual. Luego del procedimiento, el hombre puede eyacular lquido (semen). MTODOS DE PLANIFICACIN NATURAL  Planificacin familiar natural: consiste en no tener relaciones sexuales o usar un mtodo de barrera (condn, diafragma, capuchn cervical) en los das que la mujer podra quedar embarazada.  Mtodo de calendario: consiste en el seguimiento de la duracin de cada ciclo menstrual y la identificacin de los perodos frtiles.  Mtodo de ovulacin: consiste en evitar las relaciones sexuales durante la ovulacin.  Mtodo sintotrmico: consiste en evitar las relaciones sexuales en la poca en la que se est ovulando, utilizando un termmetro y tendiendo en cuenta los sntomas de la ovulacin.  Mtodo postovulacin: consiste en planificar las relaciones sexuales para despus de haber ovulado. Independientemente del tipo o mtodo anticonceptivo que usted elija, es importante que use condones para protegerse contra las infecciones de transmisin sexual (ETS). Hable con su mdico con respecto a qu mtodo anticonceptivo es el ms apropiado para usted.   Esta informacin no tiene como fin reemplazar el consejo del mdico. Asegrese de hacerle al mdico cualquier pregunta que tenga.   Document Released: 10/25/2005 Document Revised: 06/27/2013 Elsevier Interactive Patient Education 2016 Elsevier Inc.  

## 2016-07-28 ENCOUNTER — Encounter (HOSPITAL_COMMUNITY): Payer: Self-pay

## 2016-07-28 ENCOUNTER — Ambulatory Visit (HOSPITAL_COMMUNITY)
Admission: RE | Admit: 2016-07-28 | Discharge: 2016-07-28 | Disposition: A | Discharge status: Home / Self Care | Payer: Self-pay | Source: Ambulatory Visit | Attending: Nurse Practitioner | Admitting: Nurse Practitioner

## 2016-07-28 VITALS — BP 104/53 | HR 72 | Wt 162.2 lb

## 2016-07-28 DIAGNOSIS — O24112 Pre-existing diabetes mellitus, type 2, in pregnancy, second trimester: Secondary | ICD-10-CM | POA: Insufficient documentation

## 2016-07-28 DIAGNOSIS — Z3A26 26 weeks gestation of pregnancy: Secondary | ICD-10-CM | POA: Insufficient documentation

## 2016-07-28 DIAGNOSIS — O09522 Supervision of elderly multigravida, second trimester: Secondary | ICD-10-CM | POA: Insufficient documentation

## 2016-07-28 DIAGNOSIS — O24415 Gestational diabetes mellitus in pregnancy, controlled by oral hypoglycemic drugs: Secondary | ICD-10-CM

## 2016-08-12 ENCOUNTER — Ambulatory Visit (INDEPENDENT_AMBULATORY_CARE_PROVIDER_SITE_OTHER): Payer: Self-pay | Admitting: Family

## 2016-08-12 VITALS — BP 110/63 | HR 68 | Wt 161.2 lb

## 2016-08-12 DIAGNOSIS — Z758 Other problems related to medical facilities and other health care: Secondary | ICD-10-CM

## 2016-08-12 DIAGNOSIS — O09219 Supervision of pregnancy with history of pre-term labor, unspecified trimester: Secondary | ICD-10-CM

## 2016-08-12 DIAGNOSIS — O09899 Supervision of other high risk pregnancies, unspecified trimester: Secondary | ICD-10-CM

## 2016-08-12 DIAGNOSIS — O24410 Gestational diabetes mellitus in pregnancy, diet controlled: Secondary | ICD-10-CM

## 2016-08-12 DIAGNOSIS — O09213 Supervision of pregnancy with history of pre-term labor, third trimester: Secondary | ICD-10-CM

## 2016-08-12 DIAGNOSIS — Z789 Other specified health status: Secondary | ICD-10-CM

## 2016-08-12 DIAGNOSIS — Z23 Encounter for immunization: Secondary | ICD-10-CM

## 2016-08-12 DIAGNOSIS — O0990 Supervision of high risk pregnancy, unspecified, unspecified trimester: Secondary | ICD-10-CM

## 2016-08-12 DIAGNOSIS — O24415 Gestational diabetes mellitus in pregnancy, controlled by oral hypoglycemic drugs: Secondary | ICD-10-CM

## 2016-08-12 LAB — CBC
HEMATOCRIT: 32.2 % — AB (ref 35.0–45.0)
HEMOGLOBIN: 10.6 g/dL — AB (ref 11.7–15.5)
MCH: 31.3 pg (ref 27.0–33.0)
MCHC: 32.9 g/dL (ref 32.0–36.0)
MCV: 95 fL (ref 80.0–100.0)
MPV: 10.8 fL (ref 7.5–12.5)
Platelets: 208 10*3/uL (ref 140–400)
RBC: 3.39 MIL/uL — AB (ref 3.80–5.10)
RDW: 13.5 % (ref 11.0–15.0)
WBC: 7.3 10*3/uL (ref 3.8–10.8)

## 2016-08-12 MED ORDER — TETANUS-DIPHTH-ACELL PERTUSSIS 5-2.5-18.5 LF-MCG/0.5 IM SUSP
0.5000 mL | Freq: Once | INTRAMUSCULAR | Status: AC
  Administered 2016-08-12: 0.5 mL via INTRAMUSCULAR

## 2016-08-12 NOTE — Progress Notes (Signed)
   PRENATAL VISIT NOTE  Subjective:  Cheryl Haynes is a 40 y.o. [id number] at 3737w1d being seen today for ongoing prenatal care.  She is currently monitored for the following issues for this high-risk pregnancy and has Supervision of high risk pregnancy, antepartum; Gestational diabetes mellitus (GDM), antepartum; History of preterm delivery, currently pregnant; Obesity affecting pregnancy, antepartum; AMA (advanced maternal age) multigravida 35+; BMI 38.0-38.9,adult; and Language barrier on her problem list.  Patient reports numbness on right thigh when baby positioned on that side.  Last approximately 1 minute.  Contractions: Not present. Vag. Bleeding: None.  Movement: Present. Denies leaking of fluid.   The following portions of the patient's history were reviewed and updated as appropriate: allergies, current medications, past family history, past medical history, past social history, past surgical history and problem list. Problem list updated.  Objective:   Vitals:   08/12/16 0925  BP: 110/63  Pulse: 68  Weight: 161 lb 3.2 oz (73.1 kg)    Fetal Status: Fetal Heart Rate (bpm): 140 Fundal Height: 30 cm Movement: Present     General:  Alert, oriented and cooperative. Patient is in no acute distress.  Skin: Skin is warm and dry. No rash noted.   Cardiovascular: Normal heart rate noted  Respiratory: Normal respiratory effort, no problems with respiration noted  Abdomen: Soft, gravid, appropriate for gestational age. Pain/Pressure: Absent     Pelvic:  Cervical exam deferred        Extremities: Normal range of motion.  Edema: None  Mental Status: Normal mood and affect. Normal behavior. Normal judgment and thought content.   Urinalysis:      Assessment and Plan:  Pregnancy: [id number] at 5337w1d    1. Need for Tdap vaccination - Tdap (BOOSTRIX) injection 0.5 mL; Inject 0.5 mLs into the muscle once.  2. Gestational diabetes mellitus (GDM), antepartum - Continue meds,  Glyburide 2.5 mg HS - Discussed fetal testing that will begin at 32 wks - Growth ultrasound scheduled for 08/25/16 - Referred for opth exam  3. Supervision of high risk pregnancy, antepartum - CBC - RPR - HIV antibody (with reflex)  4.  Language barrier - Utilization of interpreter  5. History of preterm delivery, currently pregnant - Declined 17-p  Preterm labor symptoms and general obstetric precautions including but not limited to vaginal bleeding, contractions, leaking of fluid and fetal movement were reviewed in detail with the patient. Please refer to After Visit Summary for other counseling recommendations.  Return in about 2 weeks (around 08/26/2016).  Eino FarberWalidah Kennith GainN Karim, CNM

## 2016-08-12 NOTE — Progress Notes (Signed)
Majority of CBG within normal range

## 2016-08-13 LAB — HIV ANTIBODY (ROUTINE TESTING W REFLEX): HIV 1&2 Ab, 4th Generation: NONREACTIVE

## 2016-08-13 LAB — RPR

## 2016-08-25 ENCOUNTER — Encounter (HOSPITAL_COMMUNITY): Payer: Self-pay

## 2016-08-25 ENCOUNTER — Ambulatory Visit (HOSPITAL_COMMUNITY)
Admission: RE | Admit: 2016-08-25 | Discharge: 2016-08-25 | Disposition: A | Discharge status: Home / Self Care | Payer: Self-pay | Source: Ambulatory Visit | Attending: Nurse Practitioner | Admitting: Nurse Practitioner

## 2016-08-25 ENCOUNTER — Other Ambulatory Visit (HOSPITAL_COMMUNITY): Payer: Self-pay | Admitting: Maternal and Fetal Medicine

## 2016-08-25 DIAGNOSIS — Z3A30 30 weeks gestation of pregnancy: Secondary | ICD-10-CM

## 2016-08-25 DIAGNOSIS — O24415 Gestational diabetes mellitus in pregnancy, controlled by oral hypoglycemic drugs: Secondary | ICD-10-CM

## 2016-08-25 DIAGNOSIS — O24113 Pre-existing diabetes mellitus, type 2, in pregnancy, third trimester: Secondary | ICD-10-CM | POA: Insufficient documentation

## 2016-09-02 ENCOUNTER — Ambulatory Visit (INDEPENDENT_AMBULATORY_CARE_PROVIDER_SITE_OTHER): Payer: Self-pay | Admitting: Obstetrics and Gynecology

## 2016-09-02 VITALS — BP 91/52 | HR 72 | Wt 157.7 lb

## 2016-09-02 DIAGNOSIS — E669 Obesity, unspecified: Secondary | ICD-10-CM

## 2016-09-02 DIAGNOSIS — Z789 Other specified health status: Secondary | ICD-10-CM

## 2016-09-02 DIAGNOSIS — O0990 Supervision of high risk pregnancy, unspecified, unspecified trimester: Secondary | ICD-10-CM

## 2016-09-02 DIAGNOSIS — O09899 Supervision of other high risk pregnancies, unspecified trimester: Secondary | ICD-10-CM

## 2016-09-02 DIAGNOSIS — O09213 Supervision of pregnancy with history of pre-term labor, third trimester: Secondary | ICD-10-CM

## 2016-09-02 DIAGNOSIS — O09219 Supervision of pregnancy with history of pre-term labor, unspecified trimester: Secondary | ICD-10-CM

## 2016-09-02 DIAGNOSIS — O99213 Obesity complicating pregnancy, third trimester: Secondary | ICD-10-CM

## 2016-09-02 DIAGNOSIS — O99210 Obesity complicating pregnancy, unspecified trimester: Secondary | ICD-10-CM

## 2016-09-02 DIAGNOSIS — O09523 Supervision of elderly multigravida, third trimester: Secondary | ICD-10-CM

## 2016-09-02 DIAGNOSIS — O24415 Gestational diabetes mellitus in pregnancy, controlled by oral hypoglycemic drugs: Secondary | ICD-10-CM

## 2016-09-02 DIAGNOSIS — Z6838 Body mass index (BMI) 38.0-38.9, adult: Secondary | ICD-10-CM

## 2016-09-02 NOTE — Progress Notes (Signed)
Prenatal Visit Note Date: 09/02/2016 Clinic: [facility]  Subjective:  Cheryl Haynes is a 40 y.o. [id number] at 3637w1d being seen today for ongoing prenatal care.  She is currently monitored for the following issues for this high-risk pregnancy and has Supervision of high risk pregnancy, antepartum; Gestational diabetes mellitus (GDM), antepartum; History of preterm delivery, currently pregnant; Obesity affecting pregnancy, antepartum; AMA (advanced maternal age) multigravida 35+; BMI 38.0-38.9,adult; and Language barrier on her problem list.  Patient reports no complaints.   Contractions: Not present. Vag. Bleeding: None.  Movement: Present. Denies leaking of fluid.   The following portions of the patient's history were reviewed and updated as appropriate: allergies, current medications, past family history, past medical history, past social history, past surgical history and problem list. Problem list updated.  Objective:   Vitals:   09/02/16 0929  BP: (!) 91/52  Pulse: 72  Weight: 157 lb 11.2 oz (71.5 kg)    Fetal Status: Fetal Heart Rate (bpm): 140   Movement: Present     General:  Alert, oriented and cooperative. Patient is in no acute distress.  Skin: Skin is warm and dry. No rash noted.   Cardiovascular: Normal heart rate noted  Respiratory: Normal respiratory effort, no problems with respiration noted  Abdomen: Soft, gravid, appropriate for gestational age. Pain/Pressure: Absent     Pelvic:  Cervical exam deferred        Extremities: Normal range of motion.  Edema: None  Mental Status: Normal mood and affect. Normal behavior. Normal judgment and thought content.   Urinalysis:      Assessment and Plan:  Pregnancy: [id number] at 3837w1d  1. Supervision of high risk pregnancy, antepartum Routine care. Vasectomy. Patient doesn't have insurance.   2. Gestational diabetes mellitus (GDM) controlled on oral hypoglycemic drug, antepartum Currently on  glyburide 2.5mg  qhs. Normal BS log. Had 10/18 growth u/s efw 85%, AC>97%, AFI 14 and has repeat 11/15. Will start 2x/week testing next week.  -optho scheduled for her today. -s/p normal fetal echo.   3. Elderly multigravida in third trimester Declined genetic screening/testing.   4. Obesity affecting pregnancy, antepartum See above  5. BMI 38.0-38.9,adult See above.   6. History of preterm delivery, currently pregnant Declined 17p  7. Language barrier Interpreter used.   Preterm labor symptoms and general obstetric precautions including but not limited to vaginal bleeding, contractions, leaking of fluid and fetal movement were reviewed in detail with the patient. Please refer to After Visit Summary for other counseling recommendations.  Return in about 1 week (around 09/09/2016).   Bollinger Bingharlie Osamu Olguin, MD

## 2016-09-09 ENCOUNTER — Ambulatory Visit (INDEPENDENT_AMBULATORY_CARE_PROVIDER_SITE_OTHER): Payer: Self-pay | Admitting: Family Medicine

## 2016-09-09 VITALS — BP 110/56 | HR 84

## 2016-09-09 DIAGNOSIS — O24415 Gestational diabetes mellitus in pregnancy, controlled by oral hypoglycemic drugs: Secondary | ICD-10-CM

## 2016-09-09 DIAGNOSIS — O0990 Supervision of high risk pregnancy, unspecified, unspecified trimester: Secondary | ICD-10-CM

## 2016-09-09 NOTE — Progress Notes (Signed)
NST reviewed. Reactive

## 2016-09-09 NOTE — Progress Notes (Signed)
Used Video interpreter Paula ComptonKarla [phone].

## 2016-09-13 ENCOUNTER — Ambulatory Visit (INDEPENDENT_AMBULATORY_CARE_PROVIDER_SITE_OTHER): Payer: Self-pay | Admitting: *Deleted

## 2016-09-13 ENCOUNTER — Other Ambulatory Visit: Payer: Self-pay | Admitting: Obstetrics & Gynecology

## 2016-09-13 VITALS — BP 102/51 | HR 66

## 2016-09-13 DIAGNOSIS — Z3689 Encounter for other specified antenatal screening: Secondary | ICD-10-CM

## 2016-09-13 DIAGNOSIS — O24415 Gestational diabetes mellitus in pregnancy, controlled by oral hypoglycemic drugs: Secondary | ICD-10-CM

## 2016-09-13 NOTE — Progress Notes (Signed)
Interpreter present for encounter.  

## 2016-09-15 NOTE — Progress Notes (Signed)
NST reviewed and reactive.  

## 2016-09-16 ENCOUNTER — Ambulatory Visit (INDEPENDENT_AMBULATORY_CARE_PROVIDER_SITE_OTHER): Payer: Self-pay | Admitting: Family Medicine

## 2016-09-16 VITALS — BP 109/58 | HR 71 | Wt 156.8 lb

## 2016-09-16 DIAGNOSIS — O24415 Gestational diabetes mellitus in pregnancy, controlled by oral hypoglycemic drugs: Secondary | ICD-10-CM

## 2016-09-16 DIAGNOSIS — O0990 Supervision of high risk pregnancy, unspecified, unspecified trimester: Secondary | ICD-10-CM

## 2016-09-16 LAB — POCT URINALYSIS DIP (DEVICE)
BILIRUBIN URINE: NEGATIVE
Glucose, UA: NEGATIVE mg/dL
HGB URINE DIPSTICK: NEGATIVE
NITRITE: NEGATIVE
PH: 7 (ref 5.0–8.0)
Protein, ur: NEGATIVE mg/dL
SPECIFIC GRAVITY, URINE: 1.02 (ref 1.005–1.030)
Urobilinogen, UA: 0.2 mg/dL (ref 0.0–1.0)

## 2016-09-16 NOTE — Progress Notes (Addendum)
Spanish interpreter: [phone] Rejeana BrockPaola used   PRENATAL VISIT NOTE  Subjective:  Cheryl Haynes is a 40 y.o. [id number] at 5384w1d being seen today for ongoing prenatal care.  She is currently monitored for the following issues for this high-risk pregnancy and has Supervision of high risk pregnancy, antepartum; Gestational diabetes mellitus (GDM), antepartum; History of preterm delivery, currently pregnant; Obesity affecting pregnancy, antepartum; AMA (advanced maternal age) multigravida 35+; BMI 38.0-38.9,adult; and Language barrier on her problem list.  Patient reports no complaints.  Contractions: Irregular. Vag. Bleeding: None.  Movement: Present. Denies leaking of fluid.   The following portions of the patient's history were reviewed and updated as appropriate: allergies, current medications, past family history, past medical history, past social history, past surgical history and problem list. Problem list updated.  Objective:   Vitals:   09/16/16 0853  BP: (!) 109/58  Pulse: 71  Weight: 156 lb 12.8 oz (71.1 kg)    Fetal Status: Fetal Heart Rate (bpm): NST   Movement: Present     General:  Alert, oriented and cooperative. Patient is in no acute distress.  Skin: Skin is warm and dry. No rash noted.   Cardiovascular: Normal heart rate noted  Respiratory: Normal respiratory effort, no problems with respiration noted  Abdomen: Soft, gravid, appropriate for gestational age. Pain/Pressure: Absent     Pelvic:  Cervical exam deferred        Extremities: Normal range of motion.  Edema: None  Mental Status: Normal mood and affect. Normal behavior. Normal judgment and thought content.   Assessment and Plan:  Pregnancy: [id number] at 8184w1d  1. Gestational diabetes mellitus (GDM) controlled on oral hypoglycemic drug, antepartum No log book today FBS 58, 64,113 2 hour pp not higher than 130 Continue Glyburide and ASA - Fetal nonstress test - NST reviewed and reactive . 2. Supervision of  high risk pregnancy, antepartum Cotninue prenatal care  Preterm labor symptoms and general obstetric precautions including but not limited to vaginal bleeding, contractions, leaking of fluid and fetal movement were reviewed in detail with the patient. Please refer to After Visit Summary for other counseling recommendations.  Return in 1 week (on 09/23/2016).   Reva Boresanya S Pratt, MD

## 2016-09-16 NOTE — Progress Notes (Signed)
Interpreter Jomarie LongsBelen present for encounter.

## 2016-09-16 NOTE — Patient Instructions (Signed)
Tercer trimestre de embarazo (Third Trimester of Pregnancy) El tercer trimestre comprende desde la semana29 hasta la semana42, es decir, desde el mes7 hasta el mes9. El tercer trimestre es un perodo en el que el feto crece rpidamente. Hacia el final del noveno mes, el feto mide alrededor de 20pulgadas (45cm) de largo y pesa entre 6 y 10 libras (2,700 y 4,500kg).  CAMBIOS EN EL ORGANISMO Su organismo atraviesa por muchos cambios durante el embarazo, y estos varan de una mujer a otra.   Seguir aumentando de peso. Es de esperar que aumente entre 25 y 35libras (11 y 16kg) hacia el final del embarazo.  Podrn aparecer las primeras estras en las caderas, el abdomen y las mamas.  Puede tener necesidad de orinar con ms frecuencia porque el feto baja hacia la pelvis y ejerce presin sobre la vejiga.  Debido al embarazo podr sentir acidez estomacal con frecuencia.  Puede estar estreida, ya que ciertas hormonas enlentecen los movimientos de los msculos que empujan los desechos a travs de los intestinos.  Pueden aparecer hemorroides o abultarse e hincharse las venas (venas varicosas).  Puede sentir dolor plvico debido al aumento de peso y a que las hormonas del embarazo relajan las articulaciones entre los huesos de la pelvis. El dolor de espalda puede ser consecuencia de la sobrecarga de los msculos que soportan la postura.  Tal vez haya cambios en el cabello que pueden incluir su engrosamiento, crecimiento rpido y cambios en la textura. Adems, a algunas mujeres se les cae el cabello durante o despus del embarazo, o tienen el cabello seco o fino. Lo ms probable es que el cabello se le normalice despus del nacimiento del beb.  Las mamas seguirn creciendo y le dolern. A veces, puede haber una secrecin amarilla de las mamas llamada calostro.  El ombligo puede salir hacia afuera.  Puede sentir que le falta el aire debido a que se expande el tero.  Puede notar que el feto  "baja" o lo siente ms bajo, en el abdomen.  Puede tener una prdida de secrecin mucosa con sangre. Esto suele ocurrir en el trmino de unos pocos das a una semana antes de que comience el trabajo de parto.  El cuello del tero se vuelve delgado y blando (se borra) cerca de la fecha de parto. QU DEBE ESPERAR EN LOS EXMENES PRENATALES  Le harn exmenes prenatales cada 2semanas hasta la semana36. A partir de ese momento le harn exmenes semanales. Durante una visita prenatal de rutina:  La pesarn para asegurarse de que usted y el feto estn creciendo normalmente.  Le tomarn la presin arterial.  Le medirn el abdomen para controlar el desarrollo del beb.  Se escucharn los latidos cardacos fetales.  Se evaluarn los resultados de los estudios solicitados en visitas anteriores.  Le revisarn el cuello del tero cuando est prxima la fecha de parto para controlar si este se ha borrado. Alrededor de la semana36, el mdico le revisar el cuello del tero. Al mismo tiempo, realizar un anlisis de las secreciones del tejido vaginal. Este examen es para determinar si hay un tipo de bacteria, estreptococo Grupo B. El mdico le explicar esto con ms detalle. El mdico puede preguntarle lo siguiente:  Cmo le gustara que fuera el parto.  Cmo se siente.  Si siente los movimientos del beb.  Si ha tenido sntomas anormales, como prdida de lquido, sangrado, dolores de cabeza intensos o clicos abdominales.  Si est consumiendo algn producto que contenga tabaco, como cigarrillos, tabaco   de mascar y cigarrillos electrnicos.  Si tiene alguna pregunta. Otros exmenes o estudios de deteccin que pueden realizarse durante el tercer trimestre incluyen lo siguiente:  Anlisis de sangre para controlar los niveles de hierro (anemia).  Controles fetales para determinar su salud, nivel de actividad y crecimiento. Si tiene alguna enfermedad o hay problemas durante el embarazo, le harn  estudios.  Prueba del VIH (virus de inmunodeficiencia humana). Si corre un riesgo alto, pueden realizarle una prueba de deteccin del VIH durante el tercer trimestre del embarazo. FALSO TRABAJO DE PARTO Es posible que sienta contracciones leves e irregulares que finalmente desaparecen. Se llaman contracciones de Braxton Hicks o falso trabajo de parto. Las contracciones pueden durar horas, das o incluso semanas, antes de que el verdadero trabajo de parto se inicie. Si las contracciones ocurren a intervalos regulares, se intensifican o se hacen dolorosas, lo mejor es que la revise el mdico.  SIGNOS DE TRABAJO DE PARTO   Clicos de tipo menstrual.  Contracciones cada 5minutos o menos.  Contracciones que comienzan en la parte superior del tero y se extienden hacia abajo, a la zona inferior del abdomen y la espalda.  Sensacin de mayor presin en la pelvis o dolor de espalda.  Una secrecin de mucosidad acuosa o con sangre que sale de la vagina. Si tiene alguno de estos signos antes de la semana37 del embarazo, llame a su mdico de inmediato. Debe concurrir al hospital para que la controlen inmediatamente. INSTRUCCIONES PARA EL CUIDADO EN EL HOGAR   Evite fumar, consumir hierbas, beber alcohol y tomar frmacos que no le hayan recetado. Estas sustancias qumicas afectan la formacin y el desarrollo del beb.  No consuma ningn producto que contenga tabaco, lo que incluye cigarrillos, tabaco de mascar y cigarrillos electrnicos. Si necesita ayuda para dejar de fumar, consulte al mdico. Puede recibir asesoramiento y otro tipo de recursos para dejar de fumar.  Siga las indicaciones del mdico en relacin con el uso de medicamentos. Durante el embarazo, hay medicamentos que son seguros de tomar y otros que no.  Haga ejercicio solamente como se lo haya indicado el mdico. Sentir clicos uterinos es un buen signo para detener la actividad fsica.  Contine comiendo alimentos sanos con  regularidad.  Use un sostn que le brinde buen soporte si le duelen las mamas.  No se d baos de inmersin en agua caliente, baos turcos ni saunas.  Use el cinturn de seguridad en todo momento mientras conduce.  No coma carne cruda ni queso sin cocinar; evite el contacto con las bandejas sanitarias de los gatos y la tierra que estos animales usan. Estos elementos contienen grmenes que pueden causar defectos congnitos en el beb.  Tome las vitaminas prenatales.  Tome entre 1500 y 2000mg de calcio diariamente comenzando en la semana20 del embarazo hasta el parto.  Si est estreida, pruebe un laxante suave (si el mdico lo autoriza). Consuma ms alimentos ricos en fibra, como vegetales y frutas frescos y cereales integrales. Beba gran cantidad de lquido para mantener la orina de tono claro o color amarillo plido.  Dese baos de asiento con agua tibia para aliviar el dolor o las molestias causadas por las hemorroides. Use una crema para las hemorroides si el mdico la autoriza.  Si tiene venas varicosas, use medias de descanso. Eleve los pies durante 15minutos, 3 o 4veces por da. Limite el consumo de sal en su dieta.  Evite levantar objetos pesados, use zapatos de tacones bajos y mantenga una buena postura.  Descanse   con las piernas elevadas si tiene calambres o dolor de cintura.  Visite a su dentista si no lo ha hecho durante el embarazo. Use un cepillo de dientes blando para higienizarse los dientes y psese el hilo dental con suavidad.  Puede seguir manteniendo relaciones sexuales, a menos que el mdico le indique lo contrario.  No haga viajes largos excepto que sea absolutamente necesario y solo con la autorizacin del mdico.  Tome clases prenatales para entender, practicar y hacer preguntas sobre el trabajo de parto y el parto.  Haga un ensayo de la partida al hospital.  Prepare el bolso que llevar al hospital.  Prepare la habitacin del beb.  Concurra a todas  las visitas prenatales segn las indicaciones de su mdico. SOLICITE ATENCIN MDICA SI:  No est segura de que est en trabajo de parto o de que ha roto la bolsa de las aguas.  Tiene mareos.  Siente clicos leves, presin en la pelvis o dolor persistente en el abdomen.  Tiene nuseas, vmitos o diarrea persistentes.  Observa una secrecin vaginal con mal olor.  Siente dolor al orinar. SOLICITE ATENCIN MDICA DE INMEDIATO SI:   Tiene fiebre.  Tiene una prdida de lquido por la vagina.  Tiene sangrado o pequeas prdidas vaginales.  Siente dolor intenso o clicos en el abdomen.  Sube o baja de peso rpidamente.  Tiene dificultad para respirar y siente dolor de pecho.  Sbitamente se le hinchan mucho el rostro, las manos, los tobillos, los pies o las piernas.  No ha sentido los movimientos del beb durante una hora.  Siente un dolor de cabeza intenso que no se alivia con medicamentos.  Su visin se modifica.   Esta informacin no tiene como fin reemplazar el consejo del mdico. Asegrese de hacerle al mdico cualquier pregunta que tenga.   Document Released: 08/04/2005 Document Revised: 11/15/2014 Elsevier Interactive Patient Education 2016 [employer]   Lactancia materna (Breastfeeding) Decidir amamantar es una de las mejores elecciones que puede hacer por usted y su beb. El cambio hormonal durante el embarazo produce el desarrollo del tejido mamario y aumenta la cantidad y el tamao de los conductos galactforos. Estas hormonas tambin permiten que las protenas, los azcares y las grasas de la sangre produzcan la leche materna en las glndulas productoras de leche. Las hormonas impiden que la leche materna sea liberada antes del nacimiento del beb, adems de impulsar el flujo de leche luego del nacimiento. Una vez que ha comenzado a amamantar, pensar en el beb, as como la succin o el llanto, pueden estimular la liberacin de leche de las glndulas productoras de  leche.  LOS BENEFICIOS DE AMAMANTAR Para el beb  La primera leche (calostro) ayuda a mejorar el funcionamiento del sistema digestivo del beb.  La leche tiene anticuerpos que ayudan a prevenir las infecciones en el beb.  El beb tiene una menor incidencia de asma, alergias y del sndrome de muerte sbita del lactante.  Los nutrientes en la leche materna son mejores para el beb que la leche maternizada y estn preparados exclusivamente para cubrir las necesidades del beb.  La leche materna mejora el desarrollo cerebral del beb.  Es menos probable que el beb desarrolle otras enfermedades, como obesidad infantil, asma o diabetes mellitus de tipo 2. Para usted   La lactancia materna favorece el desarrollo de un vnculo muy especial entre la madre y el beb.  Es conveniente. La leche materna siempre est disponible a la temperatura correcta y es econmica.  La lactancia   materna ayuda a quemar caloras y a perder el peso ganado durante el embarazo.  Favorece la contraccin del tero al tamao que tena antes del embarazo de manera ms rpida y disminuye el sangrado (loquios) despus del parto.  La lactancia materna contribuye a reducir el riesgo de desarrollar diabetes mellitus de tipo 2, osteoporosis o cncer de mama o de ovario en el futuro. SIGNOS DE QUE EL BEB EST HAMBRIENTO Primeros signos de hambre  Aumenta su estado de alerta o actividad.  Se estira.  Mueve la cabeza de un lado a otro.  Mueve la cabeza y abre la boca cuando se le toca la mejilla o la comisura de la boca (reflejo de bsqueda).  Aumenta las vocalizaciones, tales como sonidos de succin, se relame los labios, emite arrullos, suspiros, o chirridos.  Mueve la mano hacia la boca.  Se chupa con ganas los dedos o las manos. Signos tardos de hambre  Est agitado.  Llora de manera intermitente. Signos de hambre extrema Los signos de hambre extrema requerirn que lo calme y lo consuele antes de que el  beb pueda alimentarse adecuadamente. No espere a que se manifiesten los siguientes signos de hambre extrema para comenzar a amamantar:   Agitacin.  Llanto intenso y fuerte.  Gritos. INFORMACIN BSICA SOBRE LA LACTANCIA MATERNA Iniciacin de la lactancia materna  Encuentre un lugar cmodo para sentarse o acostarse, con un buen respaldo para el cuello y la espalda.  Coloque una almohada o una manta enrollada debajo del beb para acomodarlo a la altura de la mama (si est sentada). Las almohadas para amamantar se han diseado especialmente a fin de servir de apoyo para los brazos y el beb mientras amamanta.  Asegrese de que el abdomen del beb est frente al suyo.   Masajee suavemente la mama. Con las yemas de los dedos, masajee la pared del pecho hacia el pezn en un movimiento circular. Esto estimula el flujo de leche. Es posible que deba continuar este movimiento mientras amamanta si la leche fluye lentamente.  Sostenga la mama con el pulgar por arriba del pezn y los otros 4 dedos por debajo de la mama. Asegrese de que los dedos se encuentren lejos del pezn y de la boca del beb.  Empuje suavemente los labios del beb con el pezn o con el dedo.  Cuando la boca del beb se abra lo suficiente, acrquelo rpidamente a la mama e introduzca todo el pezn y la zona oscura que lo rodea (areola), tanto como sea posible, dentro de la boca del beb.  Debe haber ms areola visible por arriba del labio superior del beb que por debajo del labio inferior.  La lengua del beb debe estar entre la enca inferior y la mama.  Asegrese de que la boca del beb est en la posicin correcta alrededor del pezn (prendida). Los labios del beb deben crear un sello sobre la mama y estar doblados hacia afuera (invertidos).  Es comn que el beb succione durante 2 a 3 minutos para que comience el flujo de leche materna. Cmo debe prenderse Es muy importante que le ensee al beb cmo prenderse  adecuadamente a la mama. Si el beb no se prende adecuadamente, puede causarle dolor en el pezn y reducir la produccin de leche materna, y hacer que el beb tenga un escaso aumento de peso. Adems, si el beb no se prende adecuadamente al pezn, puede tragar aire durante la alimentacin. Esto puede causarle molestias al beb. Hacer eructar al beb al   cambiar de mama puede ayudarlo a liberar el aire. Sin embargo, ensearle al beb cmo prenderse a la mama adecuadamente es la mejor manera de evitar que se sienta molesto por tragar aire mientras se alimenta. Signos de que el beb se ha prendido adecuadamente al pezn:   Tironea o succiona de modo silencioso, sin causarle dolor.  Se escucha que traga cada 3 o 4 succiones.  Hay movimientos musculares por arriba y por delante de sus odos al succionar. Signos de que el beb no se ha prendido adecuadamente al pezn:   Hace ruidos de succin o de chasquido mientras se alimenta.  Siente dolor en el pezn. Si cree que el beb no se prendi correctamente, deslice el dedo en la comisura de la boca y colquelo entre las encas del beb para interrumpir la succin. Intente comenzar a amamantar nuevamente. Signos de lactancia materna exitosa Signos del beb:   Disminuye gradualmente el nmero de succiones o cesa la succin por completo.  Se duerme.  Relaja el cuerpo.  Retiene una pequea cantidad de leche en la boca.  Se desprende solo del pecho. Signos que presenta usted:  Las mamas han aumentado la firmeza, el peso y el tamao 1 a 3 horas despus de amamantar.  Estn ms blandas inmediatamente despus de amamantar.  Un aumento del volumen de leche, y tambin un cambio en su consistencia y color se producen hacia el quinto da de lactancia materna.  Los pezones no duelen, ni estn agrietados ni sangran. Signos de que su beb recibe la cantidad de leche suficiente  Moja al menos 3 paales en 24 horas. La orina debe ser clara y de color  amarillo plido a los 5 das de vida.  Defeca al menos 3 veces en 24 horas a los 5 das de vida. La materia fecal debe ser blanda y amarillenta.  Defeca al menos 3 veces en 24 horas a los 7 das de vida. La materia fecal debe ser grumosa y amarillenta.  No registra una prdida de peso mayor del 10% del peso al nacer durante los primeros 3 das de vida.  Aumenta de peso un promedio de 4 a 7onzas (113 a 198g) por semana despus de los 4 das de vida.  Aumenta de peso, diariamente, de manera uniforme a partir de los 5 das de vida, sin registrar prdida de peso despus de las 2semanas de vida. Despus de alimentarse, es posible que el beb regurgite una pequea cantidad. Esto es frecuente. FRECUENCIA Y DURACIN DE LA LACTANCIA MATERNA El amamantamiento frecuente la ayudar a producir ms leche y a prevenir problemas de dolor en los pezones e hinchazn en las mamas. Alimente al beb cuando muestre signos de hambre o si siente la necesidad de reducir la congestin de las mamas. Esto se denomina "lactancia a demanda". Evite el uso del chupete mientras trabaja para establecer la lactancia (las primeras 4 a 6 semanas despus del nacimiento del beb). Despus de este perodo, podr ofrecerle un chupete. Las investigaciones demostraron que el uso del chupete durante el primer ao de vida del beb disminuye el riesgo de desarrollar el sndrome de muerte sbita del lactante (SMSL). Permita que el nio se alimente en cada mama todo lo que desee. Contine amamantando al beb hasta que haya terminado de alimentarse. Cuando el beb se desprende o se queda dormido mientras se est alimentando de la primera mama, ofrzcale la segunda. Debido a que, con frecuencia, los recin nacidos permanecen somnolientos las primeras semanas de vida, es posible   que deba despertar al beb para alimentarlo. Los horarios de lactancia varan de un beb a otro. Sin embargo, las siguientes reglas pueden servir como gua para ayudarla a  garantizar que el beb se alimenta adecuadamente:  Se puede amamantar a los recin nacidos (bebs de 4 semanas o menos de vida) cada 1 a 3 horas.  No deben transcurrir ms de 3 horas durante el da o 5 horas durante la noche sin que se amamante a los recin nacidos.  Debe amamantar al beb 8 veces como mnimo en un perodo de 24 horas, hasta que comience a introducir slidos en su dieta, a los 6 meses de vida aproximadamente. EXTRACCIN DE LECHE MATERNA La extraccin y el almacenamiento de la leche materna le permiten asegurarse de que el beb se alimente exclusivamente de leche materna, aun en momentos en los que no puede amamantar. Esto tiene especial importancia si debe regresar al trabajo en el perodo en que an est amamantando o si no puede estar presente en los momentos en que el beb debe alimentarse. Su asesor en lactancia puede orientarla sobre cunto tiempo es seguro almacenar leche materna.  El sacaleche es un aparato que le permite extraer leche de la mama a un recipiente estril. Luego, la leche materna extrada puede almacenarse en un refrigerador o congelador. Algunos sacaleches son manuales, mientras que otros son elctricos. Consulte a su asesor en lactancia qu tipo ser ms conveniente para usted. Los sacaleches se pueden comprar; sin embargo, algunos hospitales y grupos de apoyo a la lactancia materna alquilan sacaleches mensualmente. Un asesor en lactancia puede ensearle cmo extraer leche materna manualmente, en caso de que prefiera no usar un sacaleche.  CMO CUIDAR LAS MAMAS DURANTE LA LACTANCIA MATERNA Los pezones se secan, agrietan y duelen durante la lactancia materna. Las siguientes recomendaciones pueden ayudarla a mantener las mamas humectadas y sanas:  Evite usar jabn en los pezones.  Use un sostn de soporte. Aunque no son esenciales, las camisetas sin mangas o los sostenes especiales para amamantar estn diseados para acceder fcilmente a las mamas, para amamantar  sin tener que quitarse todo el sostn o la camiseta. Evite usar sostenes con aro o sostenes muy ajustados.  Seque al aire sus pezones durante 3 a 4minutos despus de amamantar al beb.  Utilice solo apsitos de algodn en el sostn para absorber las prdidas de leche. La prdida de un poco de leche materna entre las tomas es normal.  Utilice lanolina sobre los pezones luego de amamantar. La lanolina ayuda a mantener la humedad normal de la piel. Si usa lanolina pura, no tiene que lavarse los pezones antes de volver a alimentar al beb. La lanolina pura no es txica para el beb. Adems, puede extraer manualmente algunas gotas de leche materna y masajear suavemente esa leche sobre los pezones, para que la leche se seque al aire. Durante las primeras semanas despus de dar a luz, algunas mujeres pueden experimentar hinchazn en las mamas (congestin mamaria). La congestin puede hacer que sienta las mamas pesadas, calientes y sensibles al tacto. El pico de la congestin ocurre dentro de los 3 a 5 das despus del parto. Las siguientes recomendaciones pueden ayudarla a aliviar la congestin:  Vace por completo las mamas al amamantar o extraer leche. Puede aplicar calor hmedo en las mamas (en la ducha o con toallas hmedas para manos) antes de amamantar o extraer leche. Esto aumenta la circulacin y ayuda a que la leche fluya. Si el beb no vaca por completo las   mamas cuando lo amamanta, extraiga la leche restante despus de que haya finalizado.  Use un sostn ajustado (para amamantar o comn) o una camiseta sin mangas durante 1 o 2 das para indicar al cuerpo que disminuya ligeramente la produccin de leche.  Aplique compresas de hielo sobre las mamas, a menos que le resulte demasiado incmodo.  Asegrese de que el beb est prendido y se encuentre en la posicin correcta mientras lo alimenta. Si la congestin persiste luego de 48 horas o despus de seguir estas recomendaciones, comunquese con su  mdico o un asesor en lactancia. RECOMENDACIONES GENERALES PARA EL CUIDADO DE LA SALUD DURANTE LA LACTANCIA MATERNA  Consuma alimentos saludables. Alterne comidas y colaciones, y coma 3 de cada una por da. Dado que lo que come afecta la leche materna, es posible que algunas comidas hagan que su beb se vuelva ms irritable de lo habitual. Evite comer este tipo de alimentos si percibe que afectan de manera negativa al beb.  Beba leche, jugos de fruta y agua para satisfacer su sed (aproximadamente 10 vasos al da).  Descanse con frecuencia, reljese y tome sus vitaminas prenatales para evitar la fatiga, el estrs y la anemia.  Contine con los autocontroles de la mama.  Evite masticar y fumar tabaco. Las sustancias qumicas de los cigarrillos que pasan a la leche materna y la exposicin al humo ambiental del tabaco pueden daar al beb.  No consuma alcohol ni drogas, incluida la marihuana. Algunos medicamentos, que pueden ser perjudiciales para el beb, pueden pasar a travs de la leche materna. Es importante que consulte a su mdico antes de tomar cualquier medicamento, incluidos todos los medicamentos recetados y de venta libre, as como los suplementos vitamnicos y herbales. Puede quedar embarazada durante la lactancia. Si desea controlar la natalidad, consulte a su mdico cules son las opciones ms seguras para el beb. SOLICITE ATENCIN MDICA SI:   Usted siente que quiere dejar de amamantar o se siente frustrada con la lactancia.  Siente dolor en las mamas o en los pezones.  Sus pezones estn agrietados o sangran.  Sus pechos estn irritados, sensibles o calientes.  Tiene un rea hinchada en cualquiera de las mamas.  Siente escalofros o fiebre.  Tiene nuseas o vmitos.  Presenta una secrecin de otro lquido distinto de la leche materna de los pezones.  Sus mamas no se llenan antes de amamantar al beb para el quinto da despus del parto.  Se siente triste y  deprimida.  El beb est demasiado somnoliento como para comer bien.  El beb tiene problemas para dormir.  Moja menos de 3 paales en 24 horas.  Defeca menos de 3 veces en 24 horas.  La piel del beb o la parte blanca de los ojos se vuelven amarillentas.  El beb no ha aumentado de peso a los 5 das de vida. SOLICITE ATENCIN MDICA DE INMEDIATO SI:   El beb est muy cansado (letargo) y no se quiere despertar para comer.  Le sube la fiebre sin causa.   Esta informacin no tiene como fin reemplazar el consejo del mdico. Asegrese de hacerle al mdico cualquier pregunta que tenga.   Document Released: 10/25/2005 Document Revised: 07/16/2015 Elsevier Interactive Patient Education 2016 [employer]  

## 2016-09-20 ENCOUNTER — Ambulatory Visit (INDEPENDENT_AMBULATORY_CARE_PROVIDER_SITE_OTHER): Payer: Self-pay | Admitting: *Deleted

## 2016-09-20 VITALS — BP 105/52 | HR 69

## 2016-09-20 DIAGNOSIS — O24415 Gestational diabetes mellitus in pregnancy, controlled by oral hypoglycemic drugs: Secondary | ICD-10-CM

## 2016-09-20 NOTE — Progress Notes (Signed)
US for growth scheduled 11/16 

## 2016-09-20 NOTE — Progress Notes (Signed)
Interpreter Wallene HuhMarta present for encounter.

## 2016-09-21 NOTE — Progress Notes (Signed)
NST reactive.

## 2016-09-22 ENCOUNTER — Ambulatory Visit (HOSPITAL_COMMUNITY): Payer: Self-pay

## 2016-09-23 ENCOUNTER — Encounter (HOSPITAL_COMMUNITY): Payer: Self-pay

## 2016-09-23 ENCOUNTER — Other Ambulatory Visit (HOSPITAL_COMMUNITY): Payer: Self-pay | Admitting: *Deleted

## 2016-09-23 ENCOUNTER — Other Ambulatory Visit (HOSPITAL_COMMUNITY): Payer: Self-pay | Admitting: Maternal and Fetal Medicine

## 2016-09-23 ENCOUNTER — Ambulatory Visit (INDEPENDENT_AMBULATORY_CARE_PROVIDER_SITE_OTHER): Payer: Self-pay | Admitting: Obstetrics and Gynecology

## 2016-09-23 ENCOUNTER — Ambulatory Visit (HOSPITAL_COMMUNITY)
Admission: RE | Admit: 2016-09-23 | Discharge: 2016-09-23 | Disposition: A | Discharge status: Home / Self Care | Payer: Self-pay | Source: Ambulatory Visit | Attending: Nurse Practitioner | Admitting: Nurse Practitioner

## 2016-09-23 VITALS — BP 107/58 | HR 61 | Wt 157.6 lb

## 2016-09-23 DIAGNOSIS — O24415 Gestational diabetes mellitus in pregnancy, controlled by oral hypoglycemic drugs: Secondary | ICD-10-CM

## 2016-09-23 DIAGNOSIS — Z3A34 34 weeks gestation of pregnancy: Secondary | ICD-10-CM

## 2016-09-23 DIAGNOSIS — O09523 Supervision of elderly multigravida, third trimester: Secondary | ICD-10-CM

## 2016-09-23 DIAGNOSIS — O99210 Obesity complicating pregnancy, unspecified trimester: Secondary | ICD-10-CM

## 2016-09-23 DIAGNOSIS — O99213 Obesity complicating pregnancy, third trimester: Secondary | ICD-10-CM

## 2016-09-23 DIAGNOSIS — O24113 Pre-existing diabetes mellitus, type 2, in pregnancy, third trimester: Secondary | ICD-10-CM | POA: Insufficient documentation

## 2016-09-23 DIAGNOSIS — E669 Obesity, unspecified: Secondary | ICD-10-CM

## 2016-09-23 DIAGNOSIS — O0990 Supervision of high risk pregnancy, unspecified, unspecified trimester: Secondary | ICD-10-CM

## 2016-09-23 DIAGNOSIS — Z789 Other specified health status: Secondary | ICD-10-CM

## 2016-09-23 NOTE — Progress Notes (Signed)
US for growth done today 

## 2016-09-23 NOTE — Progress Notes (Signed)
Prenatal Visit Note Date: 09/23/2016 Clinic: [facility]  Subjective:  Cheryl Haynes is a 40 y.o. [id number] at 6135w1d being seen today for ongoing prenatal care.  She is currently monitored for the following issues for this high-risk pregnancy and has Supervision of high risk pregnancy, antepartum; Gestational diabetes mellitus (GDM), antepartum; History of preterm delivery, currently pregnant; Obesity affecting pregnancy, antepartum; AMA (advanced maternal age) multigravida 35+; BMI 38.0-38.9,adult; and Language barrier on her problem list.  Patient reports no complaints.   Contractions: Not present. Vag. Bleeding: None.  Movement: Present. Denies leaking of fluid.   The following portions of the patient's history were reviewed and updated as appropriate: allergies, current medications, past family history, past medical history, past social history, past surgical history and problem list. Problem list updated.  Objective:   Vitals:   09/23/16 0957  BP: (!) 107/58  Pulse: 61  Weight: 157 lb 9.6 oz (71.5 kg)    Fetal Status: Fetal Heart Rate (bpm): NST   Movement: Present     General:  Alert, oriented and cooperative. Patient is in no acute distress.  Skin: Skin is warm and dry. No rash noted.   Cardiovascular: Normal heart rate noted  Respiratory: Normal respiratory effort, no problems with respiration noted  Abdomen: Soft, gravid, appropriate for gestational age. Pain/Pressure: Absent     Pelvic:  Cervical exam deferred        Extremities: Normal range of motion.  Edema: None  Mental Status: Normal mood and affect. Normal behavior. Normal judgment and thought content.   Urinalysis:      Assessment and Plan:  Pregnancy: [id number] at 6335w1d  1. Supervision of high risk pregnancy, antepartum Routine care. Vasectomy. Not interested in BTL. Pt on baby asa  2. Gestational diabetes mellitus (GDM) in third trimester controlled on oral hypoglycemic drug On  glyburide 2.5 qhs with normal BS log.  Normal growth u/s with slightly high AC and rNST today (135 baseline, +accels, no decel, mod variability, toco quiet) Continue with 2x/ week testing  3. Language barrier Interpreter used  Preterm labor symptoms and general obstetric precautions including but not limited to vaginal bleeding, contractions, leaking of fluid and fetal movement were reviewed in detail with the patient. Please refer to After Visit Summary for other counseling recommendations.  Return in about 4 days (around 09/27/2016) for 11/20 and 11/22 as scheduled.   [place] Bingharlie Daylene Vandenbosch, MD

## 2016-09-24 LAB — POCT URINALYSIS DIP (DEVICE)
Bilirubin Urine: NEGATIVE
GLUCOSE, UA: NEGATIVE mg/dL
KETONES UR: NEGATIVE mg/dL
Nitrite: NEGATIVE
PH: 6.5 (ref 5.0–8.0)
PROTEIN: 30 mg/dL — AB
Specific Gravity, Urine: 1.02 (ref 1.005–1.030)
UROBILINOGEN UA: 1 mg/dL (ref 0.0–1.0)

## 2016-09-27 ENCOUNTER — Ambulatory Visit (INDEPENDENT_AMBULATORY_CARE_PROVIDER_SITE_OTHER): Payer: Self-pay | Admitting: Obstetrics & Gynecology

## 2016-09-27 VITALS — BP 101/54 | HR 63

## 2016-09-27 DIAGNOSIS — O24415 Gestational diabetes mellitus in pregnancy, controlled by oral hypoglycemic drugs: Secondary | ICD-10-CM

## 2016-09-29 ENCOUNTER — Ambulatory Visit: Payer: Self-pay

## 2016-09-29 ENCOUNTER — Ambulatory Visit (INDEPENDENT_AMBULATORY_CARE_PROVIDER_SITE_OTHER): Payer: Self-pay | Admitting: *Deleted

## 2016-09-29 DIAGNOSIS — Z3689 Encounter for other specified antenatal screening: Secondary | ICD-10-CM

## 2016-09-29 DIAGNOSIS — O24415 Gestational diabetes mellitus in pregnancy, controlled by oral hypoglycemic drugs: Secondary | ICD-10-CM

## 2016-09-29 NOTE — Progress Notes (Signed)
Interpreter [employer] Mora present for encounter.  Pt is aware of some UC's and states they are not painful just slightly tight. She endorses remembering how true labor feels. Labor sx reviewed. Baby is now vertex presentation.

## 2016-10-04 ENCOUNTER — Ambulatory Visit (INDEPENDENT_AMBULATORY_CARE_PROVIDER_SITE_OTHER): Payer: Self-pay | Admitting: Certified Nurse Midwife

## 2016-10-04 VITALS — BP 105/60 | HR 60

## 2016-10-04 DIAGNOSIS — O24415 Gestational diabetes mellitus in pregnancy, controlled by oral hypoglycemic drugs: Secondary | ICD-10-CM

## 2016-10-04 NOTE — Progress Notes (Signed)
NST performed today was reviewed and was found to be reactive.  Continue recommended antenatal testing and prenatal care.  

## 2016-10-05 NOTE — Progress Notes (Signed)
NST 130/mod/+accels, no decels Toco showed q 5-8 min ctx.   NST reactive and fetal status reassuring  Cheryl FlakeKimberly Niles Riely Baskett, MD, MPH, ABFM Attending Physician Faculty Practice- [facility]

## 2016-10-07 ENCOUNTER — Ambulatory Visit: Payer: Self-pay

## 2016-10-07 ENCOUNTER — Ambulatory Visit (INDEPENDENT_AMBULATORY_CARE_PROVIDER_SITE_OTHER): Payer: Self-pay | Admitting: Obstetrics and Gynecology

## 2016-10-07 VITALS — BP 105/47 | HR 71

## 2016-10-07 DIAGNOSIS — Z113 Encounter for screening for infections with a predominantly sexual mode of transmission: Secondary | ICD-10-CM

## 2016-10-07 DIAGNOSIS — O24415 Gestational diabetes mellitus in pregnancy, controlled by oral hypoglycemic drugs: Secondary | ICD-10-CM

## 2016-10-07 DIAGNOSIS — O09523 Supervision of elderly multigravida, third trimester: Secondary | ICD-10-CM

## 2016-10-07 DIAGNOSIS — O0990 Supervision of high risk pregnancy, unspecified, unspecified trimester: Secondary | ICD-10-CM

## 2016-10-07 DIAGNOSIS — Z789 Other specified health status: Secondary | ICD-10-CM

## 2016-10-07 LAB — POCT URINALYSIS DIP (DEVICE)
Bilirubin Urine: NEGATIVE
GLUCOSE, UA: NEGATIVE mg/dL
Ketones, ur: NEGATIVE mg/dL
NITRITE: NEGATIVE
PROTEIN: NEGATIVE mg/dL
SPECIFIC GRAVITY, URINE: 1.01 (ref 1.005–1.030)
UROBILINOGEN UA: 0.2 mg/dL (ref 0.0–1.0)
pH: 7 (ref 5.0–8.0)

## 2016-10-07 LAB — OB RESULTS CONSOLE GBS: GBS: NEGATIVE

## 2016-10-07 NOTE — Progress Notes (Signed)
US for growth scheduled 12/14

## 2016-10-07 NOTE — Progress Notes (Signed)
Subjective:  Cheryl Haynes is a 40 y.o. [id number] at 6264w1d being seen today for ongoing prenatal care.  She is currently monitored for the following issues for this high-risk pregnancy and has Supervision of high risk pregnancy, antepartum; Gestational diabetes mellitus (GDM), antepartum; History of preterm delivery, currently pregnant; Obesity affecting pregnancy, antepartum; AMA (advanced maternal age) multigravida 35+; BMI 38.0-38.9,adult; and Language barrier on her problem list.  Patient reports no complaints.  Contractions: Irregular. Vag. Bleeding: None.  Movement: Present. Denies leaking of fluid.   The following portions of the patient's history were reviewed and updated as appropriate: allergies, current medications, past family history, past medical history, past social history, past surgical history and problem list. Problem list updated.  Objective:   Vitals:   10/07/16 1257  BP: (!) 105/47  Pulse: 71    Fetal Status: Fetal Heart Rate (bpm): NST   Movement: Present  Presentation: Transverse (head on maternal Rt, spine down)  General:  Alert, oriented and cooperative. Patient is in no acute distress.  Skin: Skin is warm and dry. No rash noted.   Cardiovascular: Normal heart rate noted  Respiratory: Normal respiratory effort, no problems with respiration noted  Abdomen: Soft, gravid, appropriate for gestational age. Pain/Pressure: Present     Pelvic:  Cervical exam deferred        Extremities: Normal range of motion.  Edema: None  Mental Status: Normal mood and affect. Normal behavior. Normal judgment and thought content.   Urinalysis:      Assessment and Plan:  Pregnancy: [id number] at 4964w1d  1. Supervision of high risk pregnancy, antepartum Infant transverse today. Has been vertex and transverse during pregnancy Discussed ECV with pt. Will check position next week Consider ECV on day of IOL if needed - Culture, beta strep (group b only) - GC[facility]lam[facility]  [facility]  2. Gestational diabetes mellitus (GDM) in third trimester controlled on oral hypoglycemic drug BS in goal range continue with BASA and glyburide.  - Fetal nonstress test, reactive today - US OB Limited Growth U/S on 12/14 Continue with twice weekly testing 3. Language barrier Interrupter used today  4. Elderly multigravida in third trimester   Term labor symptoms and general obstetric precautions including but not limited to vaginal bleeding, contractions, leaking of fluid and fetal movement were reviewed in detail with the patient. Please refer to After Visit Summary for other counseling recommendations.  Return in about 1 week (around 10/14/2016) for Ob fu and NST/AFI;  12/14  Ob fu and NST, OB visit.   Hermina StaggersMichael L Phat Dalton, MD

## 2016-10-08 LAB — GC/CHLAMYDIA PROBE AMP (~~LOC~~) NOT AT ARMC
Chlamydia: NEGATIVE
Neisseria Gonorrhea: NEGATIVE

## 2016-10-09 LAB — CULTURE, BETA STREP (GROUP B ONLY)

## 2016-10-11 ENCOUNTER — Ambulatory Visit (INDEPENDENT_AMBULATORY_CARE_PROVIDER_SITE_OTHER): Payer: Self-pay | Admitting: *Deleted

## 2016-10-11 DIAGNOSIS — O24415 Gestational diabetes mellitus in pregnancy, controlled by oral hypoglycemic drugs: Secondary | ICD-10-CM

## 2016-10-11 NOTE — Progress Notes (Signed)
[name] present for encounter

## 2016-10-12 NOTE — Progress Notes (Signed)
NST reactive.

## 2016-10-15 ENCOUNTER — Ambulatory Visit (INDEPENDENT_AMBULATORY_CARE_PROVIDER_SITE_OTHER): Payer: Self-pay | Admitting: Family Medicine

## 2016-10-15 ENCOUNTER — Ambulatory Visit (HOSPITAL_COMMUNITY)
Admission: RE | Admit: 2016-10-15 | Discharge: 2016-10-15 | Disposition: A | Discharge status: Home / Self Care | Payer: Self-pay | Source: Ambulatory Visit | Attending: Family Medicine | Admitting: Family Medicine

## 2016-10-15 ENCOUNTER — Other Ambulatory Visit: Payer: Self-pay | Admitting: Family Medicine

## 2016-10-15 VITALS — BP 110/58 | HR 71 | Wt 158.0 lb

## 2016-10-15 DIAGNOSIS — Z3A37 37 weeks gestation of pregnancy: Secondary | ICD-10-CM

## 2016-10-15 DIAGNOSIS — L299 Pruritus, unspecified: Secondary | ICD-10-CM

## 2016-10-15 DIAGNOSIS — O24113 Pre-existing diabetes mellitus, type 2, in pregnancy, third trimester: Secondary | ICD-10-CM | POA: Insufficient documentation

## 2016-10-15 DIAGNOSIS — O24419 Gestational diabetes mellitus in pregnancy, unspecified control: Secondary | ICD-10-CM

## 2016-10-15 DIAGNOSIS — O0990 Supervision of high risk pregnancy, unspecified, unspecified trimester: Secondary | ICD-10-CM

## 2016-10-15 LAB — POCT URINALYSIS DIP (DEVICE)
Bilirubin Urine: NEGATIVE
GLUCOSE, UA: NEGATIVE mg/dL
KETONES UR: NEGATIVE mg/dL
NITRITE: NEGATIVE
PH: 7 (ref 5.0–8.0)
PROTEIN: NEGATIVE mg/dL
Specific Gravity, Urine: 1.015 (ref 1.005–1.030)
UROBILINOGEN UA: 0.2 mg/dL (ref 0.0–1.0)

## 2016-10-15 LAB — COMPLETE METABOLIC PANEL WITH GFR
ALBUMIN: 3 g/dL — AB (ref 3.6–5.1)
ALK PHOS: 162 U/L — AB (ref 33–115)
ALT: 86 U/L — AB (ref 6–29)
AST: 47 U/L — AB (ref 10–30)
BILIRUBIN TOTAL: 0.7 mg/dL (ref 0.2–1.2)
BUN: 13 mg/dL (ref 7–25)
CALCIUM: 8.2 mg/dL — AB (ref 8.6–10.2)
CHLORIDE: 107 mmol/L (ref 98–110)
CO2: 22 mmol/L (ref 20–31)
CREATININE: 0.46 mg/dL — AB (ref 0.50–1.10)
GFR, Est Non African American: >89 mL/min (ref >=60)
Glucose, Bld: 65 mg/dL (ref 65–99)
Potassium: 4 mmol/L (ref 3.5–5.3)
Sodium: 138 mmol/L (ref 135–146)
TOTAL PROTEIN: 6.2 g/dL (ref 6.1–8.1)

## 2016-10-15 MED ORDER — HYDROXYZINE PAMOATE 25 MG PO CAPS: 25.0000 mg | ORAL_CAPSULE | Freq: Four times a day (QID) | ORAL | 0 refills | Status: DC | PRN

## 2016-10-15 NOTE — Progress Notes (Signed)
Addendum. Scheduled for IOL 10/27/16  0730- patient notified.

## 2016-10-15 NOTE — Progress Notes (Signed)
   PRENATAL VISIT NOTE  Subjective:  Simora Narciso-Ramirez is a 40 y.o. [id number] at 2529w2d being seen today for ongoing prenatal care.  She is currently monitored for the following issues for this high-risk pregnancy and has Supervision of high risk pregnancy, antepartum; Gestational diabetes mellitus (GDM), antepartum; History of preterm delivery, currently pregnant; Obesity affecting pregnancy, antepartum; AMA (advanced maternal age) multigravida 35+; BMI 38.0-38.9,adult; and Language barrier on her problem list.  Patient reports itching all over body.  Contractions: Not present. Vag. Bleeding: None.  Movement: Present. Denies leaking of fluid.   GDM: Patient taking glyburide 2.5mg  qhs.  Reports no hypoglycemic episodes.  Tolerating medication well Fasting: controlled 2hr PP: a few at 120, but all other controlled.   The following portions of the patient's history were reviewed and updated as appropriate: allergies, current medications, past family history, past medical history, past social history, past surgical history and problem list. Problem list updated.  Objective:   Vitals:   10/15/16 1013  BP: (!) 110/58  Pulse: 71  Weight: 158 lb (71.7 kg)    Fetal Status: Fetal Heart Rate (bpm): NST   Movement: Present     General:  Alert, oriented and cooperative. Patient is in no acute distress.  Skin: Skin is warm and dry. No rash noted.   Cardiovascular: Normal heart rate noted  Respiratory: Normal respiratory effort, no problems with respiration noted  Abdomen: Soft, gravid, appropriate for gestational age. Pain/Pressure: Present     Pelvic:  Cervical exam deferred        Extremities: Normal range of motion.  Edema: None  Mental Status: Normal mood and affect. Normal behavior. Normal judgment and thought content.   Assessment and Plan:  Pregnancy: [id number] at 229w2d  1. High risk pregnancy.  2. Gestational diabetes mellitus (GDM) in third trimester, gestational diabetes method  of control unspecified Controlled. Continue glyburide. NST reactive - US MFM OB LIMITED; Future - Fetal nonstress test  3. Pruritus Prescribed vistaril - COMPLETE METABOLIC PANEL WITH GFR - Bile acids, total  Term labor symptoms and general obstetric precautions including but not limited to vaginal bleeding, contractions, leaking of fluid and fetal movement were reviewed in detail with the patient. Please refer to After Visit Summary for other counseling recommendations.  Return in about 3 days (around 10/18/2016) for as scheduled. Levie Heritage.   Jacob J Stinson, DO

## 2016-10-15 NOTE — Progress Notes (Signed)
Used [employer] Interpreter. [id number].

## 2016-10-18 ENCOUNTER — Ambulatory Visit (INDEPENDENT_AMBULATORY_CARE_PROVIDER_SITE_OTHER): Payer: Self-pay | Admitting: Obstetrics & Gynecology

## 2016-10-18 DIAGNOSIS — O24419 Gestational diabetes mellitus in pregnancy, unspecified control: Secondary | ICD-10-CM

## 2016-10-18 NOTE — Progress Notes (Signed)
NST only

## 2016-10-20 ENCOUNTER — Inpatient Hospital Stay (HOSPITAL_COMMUNITY)
Admission: AD | Admit: 2016-10-20 | Discharge: 2016-10-22 | DRG: 775 | Disposition: A | Discharge status: Home / Self Care | Payer: Medicaid Other | Source: Ambulatory Visit | Attending: Family Medicine | Admitting: Family Medicine

## 2016-10-20 ENCOUNTER — Encounter (HOSPITAL_COMMUNITY): Payer: Self-pay | Admitting: *Deleted

## 2016-10-20 ENCOUNTER — Telehealth: Payer: Self-pay

## 2016-10-20 DIAGNOSIS — K831 Obstruction of bile duct: Secondary | ICD-10-CM | POA: Diagnosis present

## 2016-10-20 DIAGNOSIS — O26613 Liver and biliary tract disorders in pregnancy, third trimester: Secondary | ICD-10-CM

## 2016-10-20 DIAGNOSIS — Z833 Family history of diabetes mellitus: Secondary | ICD-10-CM

## 2016-10-20 DIAGNOSIS — O09899 Supervision of other high risk pregnancies, unspecified trimester: Secondary | ICD-10-CM

## 2016-10-20 DIAGNOSIS — O99210 Obesity complicating pregnancy, unspecified trimester: Secondary | ICD-10-CM

## 2016-10-20 DIAGNOSIS — Z3A38 38 weeks gestation of pregnancy: Secondary | ICD-10-CM

## 2016-10-20 DIAGNOSIS — O26619 Liver and biliary tract disorders in pregnancy, unspecified trimester: Secondary | ICD-10-CM

## 2016-10-20 DIAGNOSIS — O2662 Liver and biliary tract disorders in childbirth: Principal | ICD-10-CM | POA: Diagnosis present

## 2016-10-20 DIAGNOSIS — O24425 Gestational diabetes mellitus in childbirth, controlled by oral hypoglycemic drugs: Secondary | ICD-10-CM | POA: Diagnosis present

## 2016-10-20 DIAGNOSIS — Z880 Allergy status to penicillin: Secondary | ICD-10-CM | POA: Diagnosis not present

## 2016-10-20 DIAGNOSIS — O09219 Supervision of pregnancy with history of pre-term labor, unspecified trimester: Secondary | ICD-10-CM

## 2016-10-20 DIAGNOSIS — Z8249 Family history of ischemic heart disease and other diseases of the circulatory system: Secondary | ICD-10-CM | POA: Diagnosis not present

## 2016-10-20 DIAGNOSIS — O24429 Gestational diabetes mellitus in childbirth, unspecified control: Secondary | ICD-10-CM

## 2016-10-20 DIAGNOSIS — O0990 Supervision of high risk pregnancy, unspecified, unspecified trimester: Secondary | ICD-10-CM

## 2016-10-20 LAB — CBC
HEMATOCRIT: 35.4 % — AB (ref 36.0–46.0)
Hemoglobin: 12.3 g/dL (ref 12.0–15.0)
MCH: 32.1 pg (ref 26.0–34.0)
MCHC: 34.7 g/dL (ref 30.0–36.0)
MCV: 92.4 fL (ref 78.0–100.0)
Platelets: 217 10*3/uL (ref 150–400)
RBC: 3.83 MIL/uL — AB (ref 3.87–5.11)
RDW: 13 % (ref 11.5–15.5)
WBC: 8 10*3/uL (ref 4.0–10.5)

## 2016-10-20 LAB — TYPE AND SCREEN
ABO/RH(D): A POS
Antibody Screen: NEGATIVE

## 2016-10-20 LAB — GLUCOSE, CAPILLARY
GLUCOSE-CAPILLARY: 72 mg/dL (ref 65–99)
GLUCOSE-CAPILLARY: 84 mg/dL (ref 65–99)
GLUCOSE-CAPILLARY: 84 mg/dL (ref 65–99)

## 2016-10-20 LAB — BILE ACIDS, TOTAL: BILE ACIDS TOTAL: 20 umol/L — AB (ref 0–19)

## 2016-10-20 MED ORDER — EPHEDRINE 5 MG/ML INJ
10.0000 mg | INTRAVENOUS | Status: DC | PRN
  Filled 2016-10-20: qty 4

## 2016-10-20 MED ORDER — OXYCODONE-ACETAMINOPHEN 5-325 MG PO TABS: 1.0000 | ORAL_TABLET | ORAL | Status: DC | PRN

## 2016-10-20 MED ORDER — EPHEDRINE 5 MG/ML INJ
10.0000 mg | INTRAVENOUS | Status: DC | PRN
Start: 2016-10-20 — End: 2016-10-21
  Filled 2016-10-20: qty 4

## 2016-10-20 MED ORDER — ONDANSETRON HCL 4 MG/2ML IJ SOLN
4.0000 mg | Freq: Four times a day (QID) | INTRAMUSCULAR | Status: DC | PRN
  Administered 2016-10-20: 4 mg via INTRAVENOUS
  Filled 2016-10-20: qty 2

## 2016-10-20 MED ORDER — ACETAMINOPHEN 325 MG PO TABS: 650.0000 mg | ORAL_TABLET | ORAL | Status: DC | PRN

## 2016-10-20 MED ORDER — TERBUTALINE SULFATE 1 MG/ML IJ SOLN
0.2500 mg | Freq: Once | INTRAMUSCULAR | Status: DC | PRN
  Filled 2016-10-20: qty 1

## 2016-10-20 MED ORDER — OXYCODONE-ACETAMINOPHEN 5-325 MG PO TABS: 2.0000 | ORAL_TABLET | ORAL | Status: DC | PRN

## 2016-10-20 MED ORDER — OXYTOCIN BOLUS FROM INFUSION: 500.0000 mL | Freq: Once | INTRAVENOUS | Status: DC

## 2016-10-20 MED ORDER — OXYTOCIN 40 UNITS IN LACTATED RINGERS INFUSION - SIMPLE MED: 2.5000 [IU]/h | INTRAVENOUS | Status: DC

## 2016-10-20 MED ORDER — FENTANYL CITRATE (PF) 100 MCG/2ML IJ SOLN
50.0000 ug | INTRAMUSCULAR | Status: DC | PRN
  Administered 2016-10-20: 50 ug via INTRAVENOUS
  Filled 2016-10-20: qty 2

## 2016-10-20 MED ORDER — ONDANSETRON HCL 4 MG/2ML IJ SOLN: 4.0000 mg | Freq: Four times a day (QID) | INTRAMUSCULAR | Status: DC | PRN

## 2016-10-20 MED ORDER — FENTANYL CITRATE (PF) 100 MCG/2ML IJ SOLN: 50.0000 ug | INTRAMUSCULAR | Status: DC | PRN

## 2016-10-20 MED ORDER — LACTATED RINGERS IV SOLN
INTRAVENOUS | Status: DC
  Administered 2016-10-20 (×2): via INTRAVENOUS

## 2016-10-20 MED ORDER — LACTATED RINGERS IV SOLN: 500.0000 mL | INTRAVENOUS | Status: DC | PRN

## 2016-10-20 MED ORDER — LIDOCAINE HCL (PF) 1 % IJ SOLN: 30.0000 mL | INTRAMUSCULAR | Status: DC | PRN

## 2016-10-20 MED ORDER — FENTANYL 2.5 MCG/ML BUPIVACAINE 1/10 % EPIDURAL INFUSION (WH - ANES)
14.0000 mL/h | INTRAMUSCULAR | Status: DC | PRN
  Filled 2016-10-20: qty 100

## 2016-10-20 MED ORDER — LACTATED RINGERS IV SOLN: 500.0000 mL | Freq: Once | INTRAVENOUS | Status: DC

## 2016-10-20 MED ORDER — SOD CITRATE-CITRIC ACID 500-334 MG/5ML PO SOLN: 30.0000 mL | ORAL | Status: DC | PRN

## 2016-10-20 MED ORDER — DIPHENHYDRAMINE HCL 50 MG/ML IJ SOLN: 12.5000 mg | INTRAMUSCULAR | Status: DC | PRN

## 2016-10-20 MED ORDER — PHENYLEPHRINE 40 MCG/ML (10ML) SYRINGE FOR IV PUSH (FOR BLOOD PRESSURE SUPPORT)
80.0000 ug | PREFILLED_SYRINGE | INTRAVENOUS | Status: DC | PRN
  Filled 2016-10-20: qty 5
  Filled 2016-10-20: qty 10

## 2016-10-20 MED ORDER — LIDOCAINE HCL (PF) 1 % IJ SOLN
30.0000 mL | INTRAMUSCULAR | Status: DC | PRN
  Administered 2016-10-20: 30 mL via SUBCUTANEOUS
  Filled 2016-10-20: qty 30

## 2016-10-20 MED ORDER — OXYTOCIN BOLUS FROM INFUSION
500.0000 mL | Freq: Once | INTRAVENOUS | Status: AC
Start: 2016-10-20 — End: 2016-10-20
  Administered 2016-10-20: 500 mL via INTRAVENOUS

## 2016-10-20 MED ORDER — MISOPROSTOL 200 MCG PO TABS: 50.0000 ug | ORAL_TABLET | Freq: Once | ORAL | Status: DC

## 2016-10-20 MED ORDER — OXYTOCIN 40 UNITS IN LACTATED RINGERS INFUSION - SIMPLE MED
1.0000 m[IU]/min | INTRAVENOUS | Status: DC
  Administered 2016-10-20: 2 m[IU]/min via INTRAVENOUS
  Filled 2016-10-20: qty 1000

## 2016-10-20 MED ORDER — PHENYLEPHRINE 40 MCG/ML (10ML) SYRINGE FOR IV PUSH (FOR BLOOD PRESSURE SUPPORT)
80.0000 ug | PREFILLED_SYRINGE | INTRAVENOUS | Status: DC | PRN
  Filled 2016-10-20: qty 5

## 2016-10-20 MED ORDER — LACTATED RINGERS IV SOLN: INTRAVENOUS | Status: DC

## 2016-10-20 NOTE — H&P (Signed)
LABOR AND DELIVERY ADMISSION HISTORY AND PHYSICAL NOTE  Cheryl Haynes is a 40 y.o. female [phone] with IUP at 2053w0d presenting for IOL due to cholestasis.   She reports positive fetal movement. She denies leakage of fluid or vaginal bleeding.  Prenatal History/Complications: Clinic  WOC (transfer from HD) Prenatal Labs  Dating LMP  Blood type: A/Positive/-- (06/29 0000)   Genetic Screen  declined 7/27 Antibody:Negative (06/29 0000)  Anatomic US  normal Rubella: Immune (06/29 0000)  GTT Early: 155 abnormal 3 hr  RPR: NON REAC (10/05 0951)   Flu vaccine 07/19/16 HBsAg: Negative (06/29 0000)   TDaP vaccine  08/12/16                                           Rhogam:n/a HIV: NONREACTIVE (10/05 0951)   Baby Food  breast                                              GBS: (For PCN allergy, check sensitivities)  Contraception  vasectomy Pap: Negative (10/2014)  Circumcision girl   Pediatrician [facility]   Support Person Rulon EisenmengerFelix (husband)      Past Medical History: Past Medical History:  Diagnosis Date  . Depression    postpartum after 1st delivery  . Varicose vein of leg    left leg    Past Surgical History: Past Surgical History:  Procedure Laterality Date  . NO PAST SURGERIES      Obstetrical History: OB History    Gravida Para Term Preterm AB Living   5 3 2 1 1 3    SAB TAB Ectopic Multiple Live Births   1 0 0 0 3      Social History: Social History   Social History  . Marital status: Married    Spouse name: N/A  . Number of children: N/A  . Years of education: N/A   Social History Main Topics  . Smoking status: Never Smoker  . Smokeless tobacco: Never Used  . Alcohol use No  . Drug use: No  . Sexual activity: Yes    Birth control/ protection: None   Other Topics Concern  . None   Social History Narrative  . None    Family History: Family History  Problem Relation Age of Onset  . Diabetes Father   . Hypertension Father      Allergies: No Known Allergies  Prescriptions Prior to Admission  Medication Sig Dispense Refill Last Dose  . aspirin EC 81 MG tablet Take 1 tablet (81 mg total) by mouth daily. 30 tablet 6 10/20/2016  . fexofenadine (ALLEGRA ALLERGY) 180 MG tablet Take 1 tablet (180 mg total) by mouth daily. 30 tablet 6 10/20/2016  . glyBURIDE (DIABETA) 2.5 MG tablet TK 1 T PO QHS  4 10/20/2016  . hydrOXYzine (VISTARIL) 25 MG capsule Take 1 capsule (25 mg total) by mouth every 6 (six) hours as needed. 30 capsule 0 10/20/2016  . IRON PO Take by mouth.   10/20/2016  . Prenatal Vit-Fe Fumarate-FA (MULTIVITAMIN-PRENATAL) 27-0.8 MG TABS tablet Take 1 tablet by mouth daily at 12 noon.   10/20/2016     Review of Systems   All systems reviewed and negative except as stated in HPI  Blood pressure 117/74,  pulse (!) 103, temperature 98.3 F (36.8 C), temperature source Oral, resp. rate 20, height 5' (1.524 m), weight 158 lb (71.7 kg), last menstrual period 01/28/2016. General appearance: alert, cooperative and no distress Lungs: clear to auscultation bilaterally Heart: regular rate and rhythm Abdomen: soft, non-tender; bowel sounds normal Extremities: No calf swelling or tenderness Presentation: cephalic, will verify with US Fetal monitoring: 135. Moderate variability. No decels Uterine activity: irregular contractions   SVE: 1/thick/-3  Prenatal labs: ABO, Rh: A/Positive/-- (06/29 0000) Antibody: Negative (06/29 0000) Rubella: !Error! RPR: NON REAC (10/05 0951)  HBsAg: Negative (06/29 0000)  HIV: NONREACTIVE (10/05 0951)  GBS: Negative (11/30 0000)  1 hr Glucola: 155 Genetic screening:  declined Anatomy US: normal  Prenatal Transfer Tool  Maternal Diabetes: Yes:  Diabetes Type:  Insulin/Medication controlled Genetic Screening: Declined Maternal Ultrasounds/Referrals: Normal Fetal Ultrasounds or other Referrals:  None Maternal Substance Abuse:  No Significant Maternal Medications:  Meds  include: Other: glyburide Significant Maternal Lab Results: Lab values include: Group B Strep negative, Other: Bile acids 20, AST 47, ALT 86  Results for orders placed or performed during the hospital encounter of 10/20/16 (from the past 24 hour(s))  Glucose, capillary   Collection Time: 10/20/16 12:24 PM  Result Value Ref Range   Glucose-Capillary 72 65 - 99 mg/dL  CBC   Collection Time: 10/20/16 12:25 PM  Result Value Ref Range   WBC 8.0 4.0 - 10.5 K/uL   RBC 3.83 (L) 3.87 - 5.11 MIL/uL   Hemoglobin 12.3 12.0 - 15.0 g/dL   HCT 78.235.4 (L) 95.636.0 - 21.346.0 %   MCV 92.4 78.0 - 100.0 fL   MCH 32.1 26.0 - 34.0 pg   MCHC 34.7 30.0 - 36.0 g/dL   RDW 08.613.0 57.811.5 - 46.915.5 %   Platelets 217 150 - 400 K/uL    Patient Active Problem List   Diagnosis Date Noted  . Cholestasis during pregnancy 10/20/2016  . BMI 38.0-38.9,adult 06/07/2016  . Language barrier 06/07/2016  . Supervision of high risk pregnancy, antepartum 05/31/2016  . Gestational diabetes mellitus (GDM), antepartum 05/31/2016  . History of preterm delivery, currently pregnant 05/31/2016  . Obesity affecting pregnancy, antepartum 05/31/2016  . AMA (advanced maternal age) multigravida 35+ 05/31/2016    Assessment: Cheryl Haynes is a 40 y.o. G2X5284G5P2113 at 3860w0d here for IOL due to cholestasis  #Labor: oral cytotec, plan for foley in 4 hours. #Pain: IV pain meds on request. No epidural #FWB: Cat I #ID:  GBS neg #MOF: Breast #MOC:vasectomy #Circ:  N/a. female  Clearance Cootsndrew Jermanie Haynes 10/20/2016, 12:55 PM

## 2016-10-20 NOTE — Telephone Encounter (Signed)
Received report from Dr. Adrian BlackwaterStinson that pt has cholestasis and pt received labs that indicate that she needs to be delivered NOW.  Contacted pt with Spanish interpreter and informed pt to please come in now to be induced.  I informed pt that the labs that were drawn determined that she needed to be delivered and that it would be safer for her and baby to be delivered.  Pt stated that she would come in right now.  I advised pt that once she gets to the hospital the provider will do more explanation.  Pt stated understanding.

## 2016-10-20 NOTE — Anesthesia Pain Management Evaluation Note (Signed)
  CRNA Pain Management Visit Note  Patient: Cheryl Haynes, 40 y.o., female  "Hello I am a member of the anesthesia team at [facility]. We have an anesthesia team available at all times to provide care throughout the hospital, including epidural management and anesthesia for C-section. I don't know your plan for the delivery whether it a natural birth, water birth, IV sedation, nitrous supplementation, doula or epidural, but we want to meet your pain goals."   1.Was your pain managed to your expectations on prior hospitalizations?   Yes   2.What is your expectation for pain management during this hospitalization?     Epidural and IV pain meds  3.How can we help you reach that goal? Patient leaving options open.  Spanish interpreter at bedside during interview.  Options discussed with patient, all questions answered.  Record the patient's initial score and the patient's pain goal.   Pain: 5--patient says this is acceptable and will ask for pain control interventions when she is ready  Pain Goal: 5 [facility] wants you to be able to say your pain was always managed very well.  Journi Moffa L 10/20/2016

## 2016-10-20 NOTE — Progress Notes (Signed)
S: Patient seen & examined for progress of labor. Patient uncomfortable and requesting IV pain meds.  Pt would like to wait to have her epidural for now. Denies PIH symptoms   O:  Vitals:   10/20/16 1731 10/20/16 1857 10/20/16 1929 10/20/16 1930  BP:  125/68  122/66  Pulse:  90  99  Resp: 18 19  18   Temp:  99.6 F (37.6 C) 98.9 F (37.2 C)   TempSrc:  Oral Oral   Weight:      Height:        Dilation: 2 Effacement (%): 60 Cervical Position: Posterior Station: Ballotable Presentation: Vertex Exam by:: dr wallace   FHT: Cat I tracing TOCO: q232min   A/P: Continue expectant management Anticipate SVD  Deforest HoylesNoah Wallace, DO PGY-3

## 2016-10-20 NOTE — Progress Notes (Signed)
Patient ID: Cheryl Haynes, female   DOB: 03-Mar-1976, 40 y.o.   MRN: [id number] LABOR PROGRESS NOTE  Cheryl Haynes is a 40 y.o. [id number] at 3039w0d  admitted for IOL due to cholestasis and GDM  Subjective: Bedside ultrasound performed that confirmed fetal position slightly transverse. Manipulated into true vertex positioning by Dr. Shawnie PonsPratt. Binder placed as baby is very mobile. Foley bulb placed as well  Objective: BP 108/60   Pulse 80   Temp 98.3 F (36.8 C) (Oral)   Resp 18   Ht 5' (1.524 m)   Wt 158 lb (71.7 kg)   LMP 01/28/2016 (Exact Date)   BMI 30.86 kg/m  or  Vitals:   10/20/16 1211 10/20/16 1300 10/20/16 1359 10/20/16 1440  BP:   108/60   Pulse:   80   Resp:  18 20 18   Temp:      TempSrc:      Weight: 158 lb (71.7 kg)     Height: 5' (1.524 m)        Dilation: 2 Effacement (%): 30 Cervical Position: Posterior Station: -3 Presentation: Vertex (confirmed by u/s) Exam by:: L. Leftwich-Kirby, CNM  Labs: Lab Results  Component Value Date   WBC 8.0 10/20/2016   HGB 12.3 10/20/2016   HCT 35.4 (L) 10/20/2016   MCV 92.4 10/20/2016   PLT 217 10/20/2016    Patient Active Problem List   Diagnosis Date Noted  . Cholestasis during pregnancy 10/20/2016  . Cholestasis during pregnancy in third trimester 10/20/2016  . BMI 38.0-38.9,adult 06/07/2016  . Language barrier 06/07/2016  . Supervision of high risk pregnancy, antepartum 05/31/2016  . Gestational diabetes mellitus (GDM), antepartum 05/31/2016  . History of preterm delivery, currently pregnant 05/31/2016  . Obesity affecting pregnancy, antepartum 05/31/2016  . AMA (advanced maternal age) multigravida 35+ 05/31/2016    Assessment / Plan: 40 y.o. [phone] at 9439w0d here for IOL due to cholestasis and GDM  Labor: Progressing with augmentation. S/p oral cytotec, foley bulb in place Fetal Wellbeing:  Cat I Pain Control:  None Anticipated MOD:  NSVD  Clearance CootsAndrew Matrice Herro, MD 10/20/2016, 3:11 PM

## 2016-10-21 ENCOUNTER — Encounter (HOSPITAL_COMMUNITY): Payer: Self-pay

## 2016-10-21 ENCOUNTER — Ambulatory Visit (HOSPITAL_COMMUNITY)
Admission: RE | Admit: 2016-10-21 | Discharge: 2016-10-21 | Disposition: A | Discharge status: Home / Self Care | Payer: Self-pay | Source: Ambulatory Visit | Attending: Nurse Practitioner | Admitting: Nurse Practitioner

## 2016-10-21 ENCOUNTER — Other Ambulatory Visit: Payer: Self-pay | Admitting: Family Medicine

## 2016-10-21 LAB — GLUCOSE, CAPILLARY: Glucose-Capillary: 93 mg/dL (ref 65–99)

## 2016-10-21 LAB — RPR: RPR Ser Ql: NONREACTIVE

## 2016-10-21 MED ORDER — ONDANSETRON HCL 4 MG PO TABS
4.0000 mg | ORAL_TABLET | ORAL | Status: DC | PRN
Start: 2016-10-21 — End: 2016-10-22

## 2016-10-21 MED ORDER — PRENATAL MULTIVITAMIN CH
1.0000 | ORAL_TABLET | Freq: Every day | ORAL | Status: DC
  Administered 2016-10-21 – 2016-10-22 (×2): 1 via ORAL
  Filled 2016-10-21 (×2): qty 1

## 2016-10-21 MED ORDER — SIMETHICONE 80 MG PO CHEW: 80.0000 mg | CHEWABLE_TABLET | ORAL | Status: DC | PRN

## 2016-10-21 MED ORDER — BENZOCAINE-MENTHOL 20-0.5 % EX AERO: 1.0000 "application " | INHALATION_SPRAY | CUTANEOUS | Status: DC | PRN

## 2016-10-21 MED ORDER — COCONUT OIL OIL: 1.0000 "application " | TOPICAL_OIL | Status: DC | PRN

## 2016-10-21 MED ORDER — ZOLPIDEM TARTRATE 5 MG PO TABS: 5.0000 mg | ORAL_TABLET | Freq: Every evening | ORAL | Status: DC | PRN

## 2016-10-21 MED ORDER — TETANUS-DIPHTH-ACELL PERTUSSIS 5-2.5-18.5 LF-MCG/0.5 IM SUSP: 0.5000 mL | Freq: Once | INTRAMUSCULAR | Status: DC

## 2016-10-21 MED ORDER — OXYTOCIN 40 UNITS IN LACTATED RINGERS INFUSION - SIMPLE MED: 2.5000 [IU]/h | INTRAVENOUS | Status: DC | PRN

## 2016-10-21 MED ORDER — SODIUM CHLORIDE 0.9 % IV SOLN: 250.0000 mL | INTRAVENOUS | Status: DC | PRN

## 2016-10-21 MED ORDER — WITCH HAZEL-GLYCERIN EX PADS: 1.0000 "application " | MEDICATED_PAD | CUTANEOUS | Status: DC | PRN

## 2016-10-21 MED ORDER — SODIUM CHLORIDE 0.9% FLUSH: 3.0000 mL | Freq: Two times a day (BID) | INTRAVENOUS | Status: DC

## 2016-10-21 MED ORDER — SENNOSIDES-DOCUSATE SODIUM 8.6-50 MG PO TABS
2.0000 | ORAL_TABLET | ORAL | Status: DC
  Filled 2016-10-21: qty 2

## 2016-10-21 MED ORDER — DIBUCAINE 1 % RE OINT: 1.0000 "application " | TOPICAL_OINTMENT | RECTAL | Status: DC | PRN

## 2016-10-21 MED ORDER — IBUPROFEN 600 MG PO TABS
600.0000 mg | ORAL_TABLET | Freq: Four times a day (QID) | ORAL | Status: DC
  Administered 2016-10-21 – 2016-10-22 (×6): 600 mg via ORAL
  Filled 2016-10-21 (×6): qty 1

## 2016-10-21 MED ORDER — ACETAMINOPHEN 325 MG PO TABS: 650.0000 mg | ORAL_TABLET | ORAL | Status: DC | PRN

## 2016-10-21 MED ORDER — SODIUM CHLORIDE 0.9% FLUSH: 3.0000 mL | INTRAVENOUS | Status: DC | PRN

## 2016-10-21 MED ORDER — ONDANSETRON HCL 4 MG/2ML IJ SOLN: 4.0000 mg | INTRAMUSCULAR | Status: DC | PRN

## 2016-10-21 MED ORDER — DIPHENHYDRAMINE HCL 25 MG PO CAPS: 25.0000 mg | ORAL_CAPSULE | Freq: Four times a day (QID) | ORAL | Status: DC | PRN

## 2016-10-21 NOTE — Lactation Note (Signed)
This note was copied from a baby's chart. Lactation Consultation Note  Patient Name: Cheryl Haynes Today's Date: 10/21/2016 Reason for consult: Follow-up assessment Baby at 19 hr of life. Baby was sleeping in basinet. There were multiple visitors present. Mom reports bf is going well. She denies breast or nipple pain, voiced no concerns. Mom instructed to call at next feeding for latch check.   Maternal Data    Feeding Feeding Type: Breast Fed Length of feed: 30 min  LATCH Score/Interventions                      Lactation Tools Discussed/Used     Consult Status Consult Status: Follow-up Date: 10/22/16 Follow-up type: In-patient    Rulon Eisenmengerlizabeth E Asli Tokarski 10/21/2016, 6:32 PM

## 2016-10-21 NOTE — Lactation Note (Signed)
This note was copied from a baby's chart. Lactation Consultation Note Mom spanish, understands some English, FOB interpreter for mom, understands [place]. Moms 4th baby. Her oldest is 216 and youngest is 886 yrs old. Mom BF all of them at least 1 yr. Denies difficulty. RN notified LC of blood sugar of 39. Baby STS when LC entered rm.  Hand expressed a couple of drops of colostrum. Latched baby in football position. Attempted to latch. Baby not interested in BF at this time. Applied glove finger for suck training. Applied colostrum to glove finger to stimulate baby to suckle. When baby started suckling applied baby to breast and latched w/several tries and assistance.  Mom has LARGE nipples. At this time appears to be able to obtain a deep latch. Had a shallow latch at the first couple of latches, noted lipstick shaped nipple. Re-latched w/chin tug, cheeks to breast for deep latch.  Mom has minimal breast tissue. Mom stated able to BF other children w/o supplementing. Plans on exclusive BF.  Gave LC booklet in spanish. Encouraged mom to call for assistance if needed. Stressed importance of I&O, STS, supply and demand.  Patient Name: Girl Ilah Narciso-Ramirez Today's Date: 10/21/2016 Reason for consult: Initial assessment   Maternal Data Has patient been taught Hand Expression?: Yes Does the patient have breastfeeding experience prior to this delivery?: Yes  Feeding Feeding Type: Breast Fed Length of feed: 10 min (still BF)  LATCH Score/Interventions Latch: Repeated attempts needed to sustain latch, nipple held in mouth throughout feeding, stimulation needed to elicit sucking reflex. Intervention(s): Adjust position;Assist with latch;Breast massage;Breast compression  Audible Swallowing: A few with stimulation Intervention(s): Skin to skin;Hand expression Intervention(s): Alternate breast massage  Type of Nipple: Everted at rest and after stimulation  Comfort (Breast/Nipple): Soft /  non-tender     Hold (Positioning): Assistance needed to correctly position infant at breast and maintain latch. Intervention(s): Breastfeeding basics reviewed;Support Pillows;Position options;Skin to skin  LATCH Score: 7  Lactation Tools Discussed/Used WIC Program: Yes   Consult Status Consult Status: Follow-up Date: 10/21/16 (in pm) Follow-up type: In-patient    Charyl DancerCARVER, Arda Keadle G 10/21/2016, 4:22 AM

## 2016-10-21 NOTE — Lactation Note (Signed)
This note was copied from a baby's chart. Lactation Consultation Note Called from RN d/t hypoglycemia of 33. Baby given 20 ml Alimentum via slow flow nipple. Baby needed a lot of stimulation to suckle on slow flow nipple. Baby abd. Distended. Spitting bubbles w/syring bulb to clear mouth. No emesis coming out of mouth. Noted stool in diaper. Asked FOB to change diaper then put baby STS. Mom had gotten up to BR. RN notified of formula given and stimulation needed. RN giving dextrose next. And encouraged mom to do STS instead of placing baby back in crib.  Patient Name: Girl Meda Narciso-Ramirez Today's Date: 10/21/2016 Reason for consult: Follow-up assessment;Other (Comment) (hypoglycemia)   Maternal Data Has patient been taught Hand Expression?: Yes Does the patient have breastfeeding experience prior to this delivery?: Yes  Feeding Feeding Type: Formula Nipple Type: Slow - flow Length of feed: 10 min (still BF)  LATCH Score/Interventions Latch: Too sleepy or reluctant, no latch achieved, no sucking elicited. Intervention(s): Adjust position;Assist with latch;Breast massage;Breast compression  Audible Swallowing: None Intervention(s): Skin to skin;Hand expression Intervention(s): Alternate breast massage  Type of Nipple: Everted at rest and after stimulation  Comfort (Breast/Nipple): Soft / non-tender     Hold (Positioning): Assistance needed to correctly position infant at breast and maintain latch. Intervention(s): Breastfeeding basics reviewed;Support Pillows;Position options;Skin to skin  LATCH Score: 5  Lactation Tools Discussed/Used WIC Program: Yes   Consult Status Consult Status: Follow-up Date: 10/21/16 Follow-up type: In-patient    Charyl DancerCARVER, Newel Oien G 10/21/2016, 7:33 AM

## 2016-10-21 NOTE — Progress Notes (Signed)
Post Partum Day 1 Subjective: no complaints, up ad lib, voiding, tolerating PO and + flatus  Objective: Blood pressure (!) 106/52, pulse 83, temperature 98.7 F (37.1 C), temperature source Oral, resp. rate 18, height 5' (1.524 m), weight 158 lb (71.7 kg), last menstrual period 01/28/2016, unknown if currently breastfeeding.  Physical Exam:  General: alert, cooperative and no distress Lochia: appropriate Uterine Fundus: firm DVT Evaluation: No evidence of DVT seen on physical exam.   Recent Labs  10/20/16 1225  HGB 12.3  HCT 35.4*    Assessment/Plan: Plan for discharge tomorrow     LOS: 1 day   [place] Daniela Hernan 10/21/2016, 10:19 AM I was present for the exam and agree with above. CBG in AM. In-basket message sent to [facility] for 2 hour GTT [facility].   [place] Jasir Rother, CNM 10/21/2016 10:19 AM

## 2016-10-21 NOTE — Progress Notes (Deleted)
Post Partum Day 1 Subjective: no complaints, up ad lib, voiding, tolerating PO and + flatus  Objective: Blood pressure (!) 106/52, pulse 83, temperature 98.7 F (37.1 C), temperature source Oral, resp. rate 18, height 5' (1.524 m), weight 158 lb (71.7 kg), last menstrual period 01/28/2016, unknown if currently breastfeeding.  Physical Exam:  General: alert, cooperative and no distress Lochia: appropriate Uterine Fundus: firm DVT Evaluation: No evidence of DVT seen on physical exam.   Recent Labs  10/20/16 1225  HGB 12.3  HCT 35.4*    Assessment/Plan: Plan for discharge tomorrow     LOS: 1 day   Clearance Cootsndrew Taysen Bushart 10/21/2016, 7:25 AM

## 2016-10-21 NOTE — Progress Notes (Signed)
UR chart review completed.  

## 2016-10-22 LAB — GLUCOSE, CAPILLARY: GLUCOSE-CAPILLARY: 104 mg/dL — AB (ref 65–99)

## 2016-10-22 MED ORDER — DOCUSATE SODIUM 100 MG PO CAPS: 100.0000 mg | ORAL_CAPSULE | Freq: Two times a day (BID) | ORAL | 0 refills | Status: DC | PRN

## 2016-10-22 MED ORDER — IBUPROFEN 600 MG PO TABS: 600.0000 mg | ORAL_TABLET | Freq: Four times a day (QID) | ORAL | 0 refills | Status: DC

## 2016-10-22 NOTE — Lactation Note (Signed)
This note was copied from a baby's chart. Lactation Consultation Note  Interpreter present in room for Spanish. Mother asked questions regarding volume and colostrum. Discussed supply and demand and the importance of establishing her milk supply. Mom encouraged to feed baby 8-12 times/24 hours and with feeding cues.  Reviewed engorgement care and monitoring voids/stools.   Patient Name: Cheryl Haynes Today's Date: 10/22/2016     Maternal Data    Feeding Feeding Type: Breast Fed Length of feed: 15 min  LATCH Score/Interventions                      Lactation Tools Discussed/Used     Consult Status      [name], [name] 10/22/2016, 11:04 AM

## 2016-10-22 NOTE — Discharge Summary (Signed)
OB Discharge Summary     Patient Name: Cheryl Haynes DOB: Aug 01, 1976 MRN: [id number]  Date of admission: 10/20/2016 Delivering MD: Michaele OfferMUMAW, Tilden Broz [place]   Date of discharge: 10/22/2016  Admitting diagnosis: INDUCTION Intrauterine pregnancy: 4270w0d     Secondary diagnosis:  Active Problems:   Cholestasis during pregnancy   Cholestasis during pregnancy in third trimester   Normal vaginal delivery  Additional problems: Gestational diabetes, class A2     Discharge diagnosis: Term Pregnancy Delivered, GDM A2 and Cholestasis of pregnancy                                                                                                Post partum procedures:None  Augmentation: Pitocin, Cytotec and Foley Balloon  Complications: None  Hospital course:  Induction of Labor With Vaginal Delivery   40 y.o. yo [id number] at 3770w0d was admitted to the hospital 10/20/2016 for induction of labor.  Indication for induction: A2 DM and Cholestasis of pregnancy.  Patient had an uncomplicated labor course as follows: Membrane Rupture Time/Date: 11:14 PM ,10/20/2016   Intrapartum Procedures: Episiotomy: None [1]                                         Lacerations:  1st degree [2]  Patient had delivery of a Viable infant.  Information for the patient's newborn:  Alvester Chouarciso-Ramirez, Girl Teagan [[id number]][[id number]]  Delivery Method: Vaginal, Spontaneous Delivery (Filed from Delivery Summary)   10/20/2016  Details of delivery can be found in separate delivery note.  Patient had a routine postpartum course. Patient is discharged home 10/22/16.   Physical exam Vitals:   10/21/16 0603 10/21/16 1154 10/22/16 0533 10/22/16 0600  BP: (!) 106/52 99/60 (!) 80/43 (!) 89/50  Pulse: 83 74 63 67  Resp: 18 17 18    Temp: 98.7 F (37.1 C) 98.3 F (36.8 C) 97.7 F (36.5 C)   TempSrc: Oral Oral    Weight:      Height:       General: alert, cooperative and no distress Lochia: appropriate Uterine Fundus:  firm Incision: N/A DVT Evaluation: No evidence of DVT seen on physical exam. Negative Homan's sign. No cords or calf tenderness. Labs: Lab Results  Component Value Date   WBC 8.0 10/20/2016   HGB 12.3 10/20/2016   HCT 35.4 (L) 10/20/2016   MCV 92.4 10/20/2016   PLT 217 10/20/2016   CMP Latest Ref Rng & Units 10/15/2016  Glucose 65 - 99 mg/dL 65  BUN 7 - 25 mg/dL 13  Creatinine 1.300.50 - 8.651.10 mg/dL 7.84(O0.46(L)  Sodium 962135 - 952146 mmol/L 138  Potassium 3.5 - 5.3 mmol/L 4.0  Chloride 98 - 110 mmol/L 107  CO2 20 - 31 mmol/L 22  Calcium 8.6 - 10.2 mg/dL 8.2(L)  Total Protein 6.1 - 8.1 g/dL 6.2  Total Bilirubin 0.2 - 1.2 mg/dL 0.7  Alkaline Phos 33 - 115 U/L 162(H)  AST 10 - 30 U/L 47(H)  ALT 6 - 29 U/L 86(H)    Discharge  instruction: per After Visit Summary and "Baby and Me Booklet".  After visit meds:  Allergies as of 10/22/2016   No Known Allergies     Medication List    STOP taking these medications   aspirin EC 81 MG tablet   glyBURIDE 2.5 MG tablet Commonly known as:  DIABETA   hydrOXYzine 25 MG capsule Commonly known as:  VISTARIL     TAKE these medications   docusate sodium 100 MG capsule Commonly known as:  COLACE Take 1 capsule (100 mg total) by mouth 2 (two) times daily as needed for mild constipation or moderate constipation.   fexofenadine 180 MG tablet Commonly known as:  ALLEGRA ALLERGY Take 1 tablet (180 mg total) by mouth daily.   ibuprofen 600 MG tablet Commonly known as:  ADVIL,MOTRIN Take 1 tablet (600 mg total) by mouth every 6 (six) hours.   IRON PO Take by mouth.   multivitamin-prenatal 27-0.8 MG Tabs tablet Take 1 tablet by mouth daily at 12 noon.       Diet: routine diet  Activity: Advance as tolerated. Pelvic rest for 6 weeks.   Outpatient follow up:6 weeks, will have 2 hr GTT at visit. Follow up Appt:Future Appointments Date Time Provider Department Center  12/02/2016 3:20 PM Marylene LandKathryn Lorraine Kooistra, [place] [facility]     Postpartum contraception: Vasectomy  Newborn Data: Live born female  Birth Weight: 7 lb 3.3 oz (3270 g) APGAR: 8, 9  Baby Feeding: Breast Disposition:home with mother   10/22/2016 Jen MowElizabeth Tylik Treese, DO OB Fellow

## 2016-10-22 NOTE — Discharge Instructions (Signed)
Eleccin del mtodo anticonceptivo (Contraception Choices) La anticoncepcin (control de la natalidad) es el uso de cualquier mtodo o dispositivo para Location manager. A continuacin se indican algunos de esos mtodos. MTODOS HORMONALES  El Implante contraconceptivo consiste en un tubo plstico delgado que contiene la hormona progesterona. No contiene estrgenos. El mdico inserta el tubo en la parte interna del brazo. El tubo puede Geneticist, molecular durante 3 aos. Despus de los 3 aos debe retirarse. El implante impide que los ovarios liberen vulos (ovulacin), espesa el moco cervical, lo que evita que los espermatozoides ingresen al tero y hace ms delgada la membrana que cubre el interior del tero.  Inyecciones de progesterona sola: las Insurance underwriter cada 3 meses para Location manager. La progesterona sinttica impide que los ovarios liberen vulos. Tambin hacen que el moco cervical se espese y modifique el tejido de recubrimiento interno del tero. Esto hace ms difcil que los espermatozoides sobrevivan en el tero.  Las pldoras anticonceptivas contienen estrgenos y Education officer, museum. Su funcin es [employer] ovarios liberen vulos (ovulacin). Las hormonas de los anticonceptivos orales hacen que el moco cervical se haga ms espeso, lo que evita que el esperma ingrese al tero. Las pldoras anticonceptivas son recetadas por el mdico.Tambin se utilizan para tratar los perodos menstruales abundantes.  Minipldora: este tipo de pldora anticonceptiva contiene slo hormona progesterona. Deben tomarse todos los [address]  [address] del mes y debe recetarlas el mdico.  El parche de control de natalidad: contiene hormonas similares a las que contienen las pldoras anticonceptivas. Deben cambiarse una vez por semana y se utilizan bajo prescripcin mdica.  Anillo vaginal: contiene hormonas similares a las que contienen las pldoras anticonceptivas. Se deja colocado durante tres semanas, se  lo retira durante 1 semana y luego se coloca uno nuevo. La paciente debe sentirse cmoda al insertar y retirar el anillo de la vagina.Es necesaria la prescripcin mdica.  Anticonceptivos de emergencia: son mtodos para evitar un embarazo despus de Neomia Dear relacin sexual sin proteccin. Esta pldora puede tomarse inmediatamente despus de Child psychotherapist sexuales o hasta 5 [place] de haber tenido sexo sin proteccin. Es ms efectiva si se toma poco tiempo despus de la relacin sexual. Los anticonceptivos de emergencia estn disponibles sin prescripcin mdica. Consltelo con su farmacutico. No use los anticonceptivos de emergencia como nico mtodo anticonceptivo. MTODOS DE [name]  Condn masculino: es una vaina delgada (ltex o goma) que se coloca cubriendo al pene durante el acto sexual. Deri Fuelling con espermicida para aumentar la efectividad.  Condn femenino. Es una funda delicada y blanda que se adapta holgadamente a la vagina antes de las Clinical research associate.  Diafragma: es una barrera de ltex redonda y suave que debe ser recomendado por un profesional. Se inserta en la vagina, junto con un gel espermicida. Debe insertarse antes de Management consultant. Debe dejar el diafragma colocado en la vagina durante 6 a 8 horas despus de la relacin sexual.  Capuchn cervical: es una barrera de ltex o taza plstica redonda y [place] que cubre el cuello del tero y debe ser colocada por un mdico. Puede dejarlo colocado en la vagina hasta 48 horas despus de las Clinical research associate.  Esponja: es una pieza blanda y circular de espuma de poliuretano. Contiene un espermicida. Se inserta en la vagina despus de mojarla y antes de las [employer].  Espermicidas: son sustancias qumicas que matan o bloquean al esperma y no lo dejan ingresar al cuello del tero y al tero. Vienen en forma  de cremas, geles, supositorios, espuma o comprimidos. No es necesario tener Emergency planning/management officerreceta mdica. Se insertan en la  vagina con un aplicador antes de Management consultanttener relaciones sexuales. El proceso debe repetirse cada vez que tiene relaciones sexuales. ANTICONCEPTIVOS INTRAUTERINOS  Dispositivo intrauterino (DIU) es un dispositivo en forma de T que se coloca en el tero durante el perodo menstrual, para Location managerevitar el embarazo. Hay dos tipos:  DIU de cobre: este tipo de DIU est recubierto con un alambre de cobre y se inserta dentro del tero. El cobre hace que el tero y las trompas de Falopio produzcan un liquido que Federated Department Storesdestruye los espermatozoides. Puede permanecer colocado durante 10 aos.  DIU con hormona: este tipo de DIU contiene la hormona progestina (progesterona sinttica). La hormona espesa el moco cervical y evita que los espermatozoides ingresen al tero y tambin afina la membrana que cubre el tero para evitar la implantacin del vulo fertilizado. La hormona debilita o destruye los espermatozoides que ingresan al tero. Puede Geneticist, molecularpermanecer en el lugar durante 3-5 aos, segn el tipo de DIU que se McNabutilice. MTODOS ANTICONCEPTIVOS PERMANENTES  Ligadura de trompas en la mujer: se realiza sellando, atando u obstruyendo quirrgicamente las trompas de Falopio lo que impide que el vulo descienda hacia el tero.  Esterilizacin histeroscpica: Implica la colocacin de un pequeo espiral o la insercin en cada trompa de Falopio. El mdico utiliza una tcnica llamada histeroscopa para Primary school teacherrealizar este procedimiento. El dispositivo produce la formacin de tejido Designer, television/film setcicatrizal. Esto da como resultado una obstruccin permanente de las trompas de Falopio, de modo que la esperma no pueda fertilizar el vulo. Demora alrededor de 3 meses despus del procedimiento hasta que el conducto se obstruye. Tendr que usar otro mtodo anticonceptivo durante al menos 3 meses.  Esterilizacin masculina: se realiza ligando los conductos por los que pasan los espermatozoides (vasectoma).Esto impide que el esperma ingrese a la vagina durante el acto  sexual. Luego del procedimiento, el hombre puede eyacular lquido (semen). MTODOS DE PLANIFICACIN NATURAL  Planificacin familiar natural: consiste en no Management consultanttener relaciones sexuales o usar un mtodo de barrera (condn, [place] Westdiafragma, capuchn cervical) en los IKON Office Solutionsdas que la mujer podra quedar Keasbeyembarazada.  Mtodo de calendario: consiste en el seguimiento de la duracin de cada ciclo menstrual y la identificacin de los perodos frtiles.  Mtodo de ovulacin: Paramedicconsiste en evitar las relaciones sexuales durante la ovulacin.  Mtodo sintotrmico: Advertising copywriterconsiste en evitar las relaciones sexuales en la poca en la que se est ovulando, utilizando un termmetro y tendiendo en cuenta los sntomas de la ovulacin.  Mtodo postovulacin: Youth workerconsiste en planificar las relaciones sexuales para despus de haber ovulado. Independientemente del tipo o mtodo anticonceptivo que usted elija, es importante que use condones para protegerse contra las infecciones de transmisin sexual (ETS). Hable con su mdico con respecto a qu mtodo anticonceptivo es el ms apropiado para usted. Esta informacin no tiene Theme park managercomo fin reemplazar el consejo del mdico. Asegrese de hacerle al mdico cualquier pregunta que tenga. Document Released: 10/25/2005 Document Revised: 06/27/2013 Document Reviewed: 04/19/2013 Elsevier Interactive Patient Education  2017 [employer]vier [employer]   Parto vaginal, cuidados posteriores (Vaginal Delivery, Care After) Siga estas instrucciones durante las prximas semanas. Estas indicaciones le proporcionan informacin acerca de cmo deber cuidarse despus del parto. El mdico tambin podr darle instrucciones ms especficas. El tratamiento ha sido planificado segn las prcticas mdicas actuales, pero en algunos casos pueden ocurrir problemas. Llame al mdico si tiene problemas o preguntas. QU ESPERAR DESPUS DEL PARTO Despus de un parto vaginal, es frecuente tener lo  siguiente:  Hemorragia leve de la  vagina.  Dolor en el abdomen, la vagina y la zona de la piel entre la abertura vaginal y el ano (perineo).  Calambres plvicos.  Fatiga. INSTRUCCIONES PARA EL CUIDADO EN EL HOGAR Medicamentos   [employer] de venta libre y los recetados solamente como se lo haya indicado el mdico.  Si le recetaron un antibitico, tmelo como se lo haya indicado el mdico. No interrumpa la administracin del antibitico hasta que lo haya terminado. Conducir   No conduzca ni opere maquinaria pesada mientras toma analgsicos recetados.  No conduzca durante 24horas si le administraron un sedante. Estilo de vida   No beba alcohol. Esto es de suma importancia si est amamantando o toma analgsicos.  No consuma productos que contengan tabaco, incluidos cigarrillos, tabaco de Theatre manager o cigarrillos electrnicos. Si necesita ayuda para dejar de fumar, consulte al mdico. Comida y bebida   Beba al menos 8vasos de 8onzas (240cc) de agua todos los [address]  [address] a menos que el mdico le indique lo contrario. Si elige amamantar al beb, quiz deba beber an ms cantidad de agua.  Ingiera alimentos ricos en [employer]. Estos alimentos pueden ayudarla a prevenir o Educational psychologist. Los alimentos ricos en fibras incluyen, entre otros:  Panes y cereales integrales.  Arroz integral.  Hilda Lias.  Nils Pyle y verduras frescas. Actividad   Reanude sus actividades normales como se lo haya indicado el mdico. Pregntele al mdico qu actividades son seguras para usted.  Descanse todo lo que pueda. Trate de descansar o tomar una siesta mientras el beb est durmiendo.  No levante objetos que pesen ms de 10libras (4,5kg) hasta que el mdico le diga que es seguro Christmas.  Hable con el mdico sobre cundo puede volver a Management consultant. Esto puede depender de lo siguiente:  Riesgo de sufrir infecciones.  Velocidad de cicatrizacin.  Comodidad y deseo de [employer] Cuidados vaginales   Si le realizaron una episiotoma o tuvo un desgarro vaginal, contrlese la zona todos los [place] para detectar signos de infeccin. Est atenta a los siguientes signos:  Aumento del enrojecimiento, la hinchazn o Chief Technology Officer.  Ms lquido [place] Jansky.  Calor.  Pus o mal olor.  No use tampones ni se haga duchas vaginales hasta que el mdico la autorice.  Controle la sangre que elimina por la vagina para detectar cogulos. Pueden tener el aspecto de grumos de color rojo oscuro, marrn o negro. Instrucciones generales   Mantenga el perineo limpio y seco, como se lo haya indicado el mdico.  Use ropa cmoda y suelta.  Cuando vaya al bao, siempre higiencese de adelante [employer].  Pregntele al mdico si puede ducharse o tomar baos de inmersin. Si se le realiz una episiotoma o tuvo un desgarro perineal durante el trabajo del parto o el parto, es posible que el mdico le indique que no tome baos de inmersin durante un determinado tiempo.  Use un sostn que sujete y ajuste bien sus pechos.  Si es posible, pdale a alguien que la ayude con las tareas del hogar y a Scientist, product/process development del beb durante al menos algunos das despus de salir del hospital.  Chauncy Passy a todas las visitas de seguimiento para usted y el beb, como se lo haya indicado el mdico. Esto es importante. SOLICITE ATENCIN MDICA SI:  Tiene los siguientes sntomas:  Secrecin vaginal que tiene mal olor.  Dificultad para orinar.  Dolor al Beatrix Shipper.  Aumento o disminucin repentinos de  la frecuencia con que defeca.  Ms enrojecimiento, hinchazn o dolor alrededor de la episiotoma o del desgarro vaginal.  Ms secrecin de lquido o sangre de la episiotoma o desgarro vaginal.  Pus o mal olor proveniente de la episiotoma o el desgarro vaginal.  Grant Ruts.  Erupcin cutnea.  Poco inters o falta de inters en actividades que solan gustarle.  Dudas sobre su cuidado y el del beb.  Siente  la episiotoma o el desgarro vaginal caliente al tacto.  La episiotoma o el desgarro vaginal se est abriendo o no Adult nurse.  Siente dolor en las [place], o estn duras o enrojecidas.  Siente tristeza o preocupacin de forma inusual.  Siente nuseas o vomita.  Elimina cogulos grandes por la vagina. Si expulsa un cogulo sanguneo por la vagina, gurdelo para mostrrselo a su mdico. No tire la cadena sin que el mdico examine el cogulo antes.  Orina ms de lo habitual.  Se siente mareada o se desmaya.  No ha amamantado para nada y no ha tenido un perodo menstrual durante 12 semanas despus del [place].  Dej de amamantar al beb y no ha tenido su perodo menstrual durante 12 semanas despus de dejar de Museum/gallery exhibitions officer. SOLICITE ATENCIN MDICA DE INMEDIATO SI:  Tiene los siguientes sntomas:  Dolor que no desaparece o no mejora con Research scientist (medical).  Dolor en el pecho.  Dificultad para respirar.  Visin borrosa o Nurse, adult.  Pensamientos de autolesionarse o lesionar al beb.  Comienza a Psychiatrist abdomen o en una de las piernas.  Dolor de cabeza intenso.  Se desmaya.  Tiene una hemorragia tan intensa de la vagina que empapa dos toallitas sanitarias en [place]. Esta informacin no tiene Theme park manager el consejo del mdico. Asegrese de hacerle al mdico cualquier pregunta que tenga. Document Released: 10/25/2005 Document Revised: 02/16/2016 Document Reviewed: 11/09/2015 Elsevier Interactive Patient Education  2017 [employer].  Almacenamiento de la [place] materna (Storing Breast Milk) La leche materna es un lquido vital que contiene clulas que combaten infecciones (anticuerpos). La [employer] se sac con anterioridad (extrajo) debe almacenarse de un cierto modo para que mantenga su eficacia en lo que concierne a la proteccin del beb contra las infecciones. Las siguientes pautas son para Airline pilot materna para un beb sano  nacido a trmino.  CUNTO TIEMPO SE PUEDE ALMACENAR LA LECHE MATERNA?  La leche puede almacenarse hasta 4horas a temperatura ambiente o a una temperatura de 52F a 66F (15,6C a 19,4C). Sin embargo, se la puede guardar durante 6 a 8horas si las piezas del sacaleches y los recipientes estn bien higienizados.  La leche puede almacenarse durante 3das en un refrigerador a menos de 76F (3,9C). Sin embargo, se la puede guardar durante 8das si las piezas del sacaleches y los recipientes estn bien higienizados.  La leche puede almacenarse durante 2semanas en un congelador que se encuentra dentro de Community education officer.  La leche puede almacenarse durante 3 o en un congelador con una puerta aparte.  La leche puede almacenarse durante 6 a en una cmara de congelacin a -66F (-20C). Posey Boyer de congelacin es un arcn congelador o un congelador independiente que no se abre con frecuencia y se mantiene a una temperatura inferior. CMO DEBO ALMACENAR LA LECHE MATERNA?  La leche materna puede almacenarse de la siguiente forma:  En un recipiente de vidrio.  En un envase de plstico rgido.  En una bolsa de plstico diseada especialmente para  almacenar WPS Resourcesleche. Muchas mujeres prefieren esta opcin porque ocupa menos espacio y se puede Press photographerconectar directamente al sacaleches.  Almacene la leche en porciones de 2 a 4onzas (60 a 120ml) para que sea ms fcil descongelarla. Adems, esto es til para no desechar la Temple-Inlandleche que el beb no toma.  Deje un espacio de aproximadamente una pulgada en la parte superior de la bolsa o el bibern para permitir que la Mission Viejoleche se expanda a medida que se congela.  Etiquete cada recipiente con la fecha y la hora que se sac la leche para usarla en el orden en que se extrajo.  Si va a congelar la [place]che, gurdela en la parte posterior del congelador para evitar que los cambios de temperatura que se producen al abrir la puerta de la unidad la  afecten. CMO DESCONGELAR LA LECHE MATERNA CONGELADA  Se puede descongelar la leche materna de la siguiente forma:  En un refrigerador.  Debajo del chorro de agua tibia del grifo.  En un recipiente con agua tibia que haya sido calentada en la estufa.  No caliente la YRC Worldwideleche directamente en la estufa o en el microondas ya que se destruirn algunas de sus propiedades para combatir infecciones.  Se puede almacenar la Colgate Palmoliveleche materna descongelada en un refrigerador durante 24horas como mximo, pero no se debe volver a Camera operatorcongelar.  Si desea agregar WPS Resourcesleche recientemente extrada a la Australialeche congelada, debe agregar menos cantidad de Wil[place]che de la que ya est congelada. Enfre la WPS Resourcesleche recientemente extrada en el refrigerador durante 30minutos antes de agregrsela a la leche que est en Personal assistantel congelador. Esta informacin no tiene Theme park managercomo fin reemplazar el consejo del mdico. Asegrese de hacerle al mdico cualquier pregunta que tenga. Document Released: 10/13/2009 Document Revised: 10/30/2013 Elsevier Interactive Patient Education  2017 [employer]vin[employer]vier Inc.

## 2016-10-23 ENCOUNTER — Ambulatory Visit: Payer: Self-pay

## 2016-10-23 NOTE — Lactation Note (Signed)
This note was copied from a baby's chart. Lactation Consultation Note  Patient Name: Cheryl Haynes Today's Date: 10/23/2016 Reason for consult: Follow-up assessment In house Spanish interpreter present for visit. Mom reports baby is nursing well now. Has some superficial cracking and positional stripes bilateral but Mom reports she now has less discomfort with nursing and feels latch is better. Mom reports her milk is coming in. Reviewed care for sore nipples. Hand pump given for home use, changed flange to 27. Encouraged to continue to BF with feeding ques, consider post pumping 4 times/day for 15 minutes and giving baby back any amount of EBM received. Weight loss today at 8%, stools are now yellow. Engorgement care reviewed if needed, advised of OP services. Offered to assist with latch before d/c home due to nipple soreness, Mom declined.   Maternal Data    Feeding Feeding Type: Breast Fed Length of feed: 40 min  LATCH Score/Interventions                      Lactation Tools Discussed/Used Tools: Pump Breast pump type: Manual   Consult Status Consult Status: Complete Date: 10/23/16 Follow-up type: In-patient    Alfred LevinsGranger, Merlean Pizzini Ann 10/23/2016, 12:51 PM

## 2016-12-02 ENCOUNTER — Ambulatory Visit (INDEPENDENT_AMBULATORY_CARE_PROVIDER_SITE_OTHER): Payer: Self-pay | Admitting: Advanced Practice Midwife

## 2016-12-02 ENCOUNTER — Encounter: Payer: Self-pay | Admitting: Advanced Practice Midwife

## 2016-12-02 ENCOUNTER — Ambulatory Visit: Payer: Self-pay | Admitting: Student

## 2016-12-02 VITALS — BP 118/70 | HR 62 | Wt 149.5 lb

## 2016-12-02 DIAGNOSIS — O24419 Gestational diabetes mellitus in pregnancy, unspecified control: Secondary | ICD-10-CM

## 2016-12-02 DIAGNOSIS — O99814 Abnormal glucose complicating childbirth: Secondary | ICD-10-CM

## 2016-12-02 DIAGNOSIS — O09899 Supervision of other high risk pregnancies, unspecified trimester: Secondary | ICD-10-CM

## 2016-12-02 DIAGNOSIS — Z1151 Encounter for screening for human papillomavirus (HPV): Secondary | ICD-10-CM

## 2016-12-02 DIAGNOSIS — O0990 Supervision of high risk pregnancy, unspecified, unspecified trimester: Secondary | ICD-10-CM

## 2016-12-02 DIAGNOSIS — O09219 Supervision of pregnancy with history of pre-term labor, unspecified trimester: Secondary | ICD-10-CM

## 2016-12-02 DIAGNOSIS — Z124 Encounter for screening for malignant neoplasm of cervix: Secondary | ICD-10-CM

## 2016-12-02 DIAGNOSIS — Z3009 Encounter for other general counseling and advice on contraception: Secondary | ICD-10-CM

## 2016-12-02 LAB — POCT PREGNANCY, URINE: Preg Test, Ur: NEGATIVE

## 2016-12-02 MED ORDER — NORELGESTROMIN-ETH ESTRADIOL 150-35 MCG/24HR TD PTWK: 1.0000 | MEDICATED_PATCH | TRANSDERMAL | 12 refills | Status: DC

## 2016-12-02 MED ORDER — NORETHINDRONE 0.35 MG PO TABS: 1.0000 | ORAL_TABLET | Freq: Every day | ORAL | 12 refills | Status: DC

## 2016-12-02 NOTE — Progress Notes (Signed)
Subjective:     Cheryl Haynes is a 41 y.o. female who presents for a postpartum visit. She is 6 weeks postpartum following a spontaneous vaginal delivery. I have fully reviewed the prenatal and intrapartum course. The delivery was at 38 gestational weeks. Outcome: spontaneous vaginal delivery. Anesthesia: none. Postpartum course has been Normal. Baby's course has been Normal. Baby is feeding by both breast and bottle -  . Bleeding no bleeding. Bowel function is normal. Bladder function is normal. Patient is not sexually active. Contraception method is abstinence. Postpartum depression screening: negative.  The following portions of the patient's history were reviewed and updated as appropriate: allergies, current medications, past family history, past medical history, past social history, past surgical history and problem list.  Review of Systems Pertinent items are noted in HPI.   Objective:    BP 118/70   Pulse 62   Wt 149 lb 8 oz (67.8 kg)   Breastfeeding? Yes   BMI 29.20 kg/m   General:  alert, cooperative, appears stated age and no distress   Breasts:  Declined  Lungs: clear to auscultation bilaterally  Heart:  regular rate and rhythm, S1, S2 normal, no murmur, click, rub or gallop  Abdomen: soft, non-tender; bowel sounds normal; no masses,  no organomegaly   Vulva:  not evaluated  Vagina: not evaluated  Rectal Exam: Not performed.        Assessment:     Normal postpartum exam. Pap smear not done at today's visit.  1. Abnormal maternal glucose tolerance, with delivery  - Glucose tolerance, 2 hours  2. Gestational diabetes mellitus (GDM), antepartum, gestational diabetes method of control unspecified   3. Birth control counseling. Pt prefers Patch. Recommended that she wait until 6 month PP decrease risk of decreased milk supply. Also discussed that cost may be prohibitive. Will use POPs then switch to patch,. Verbalized understanding that she is not supposed to use  both at same time.   - norethindrone (MICRONOR,CAMILA,ERRIN) 0.35 MG tablet; Take 1 tablet (0.35 mg total) by mouth daily.  Dispense: 1 Package; Refill: 12 - norelgestromin-ethinyl estradiol (ORTHO EVRA) 150-35 MCG/24HR transdermal patch; Place 1 patch onto the skin once a week.  Dispense: 3 patch; Refill: 12  4. Postpartum care and examination   Plan:    1. Contraception: POPs until 6 months PP, then Ortho-Evra patches weekly 2. Follow up in: 1 year for annual Gyn or as needed.  IUD scholarship form given.

## 2016-12-02 NOTE — Progress Notes (Signed)
Called Health department to get pap report faxed. They will find it and bring it later today.

## 2016-12-02 NOTE — Patient Instructions (Signed)
[facility] Phone: [phone] Location: Lower level of [facility] ([address]) Hours: 7:30 a.m. to 6:00 p.m., Monday through Friday.    [facility] Phone: [phone] Location: [address] Hours: 7:30 a.m. to 6:00 p.m., Monday through Friday.    Eleccin del mtodo anticonceptivo (Contraception Choices) La anticoncepcin (control de la natalidad) es el uso de cualquier mtodo o dispositivo para Location managerevitar el embarazo. A continuacin se indican algunos de esos mtodos. MTODOS HORMONALES  El Implante contraconceptivo consiste en un tubo plstico delgado que contiene la hormona progesterona. No contiene estrgenos. El mdico inserta el tubo en la parte interna del brazo. El tubo puede Geneticist, molecularpermanecer en el lugar durante 3 aos. Despus de los 3 aos debe retirarse. El implante impide que los ovarios liberen vulos (ovulacin), espesa el moco cervical, lo que evita que los espermatozoides ingresen al tero y hace ms delgada la membrana que cubre el interior del tero.  Inyecciones de progesterona sola: las Insurance underwriteradministra el mdico cada 3 meses para Location managerevitar el embarazo. La progesterona sinttica impide que los ovarios liberen vulos. Tambin hacen que el moco cervical se espese y modifique el tejido de recubrimiento interno del tero. Esto hace ms difcil que los espermatozoides sobrevivan en el tero.  Las pldoras anticonceptivas contienen estrgenos y Education officer, museumprogesterona. Su funcin es ALLTEL Corporationevitar que los ovarios liberen vulos (ovulacin). Las hormonas de los anticonceptivos orales hacen que el moco cervical se haga ms espeso, lo que evita que el esperma ingrese al tero. Las pldoras anticonceptivas son recetadas por el mdico.Tambin se utilizan para tratar los perodos menstruales abundantes.  Minipldora: este tipo de pldora anticonceptiva contiene slo hormona progesterona. Deben tomarse todos los [address]  [address] del mes y debe  recetarlas el mdico.  El parche de control de natalidad: contiene hormonas similares a las que contienen las pldoras anticonceptivas. Deben cambiarse una vez por semana y se utilizan bajo prescripcin mdica.  Anillo vaginal: contiene hormonas similares a las que contienen las pldoras anticonceptivas. Se deja colocado durante tres semanas, se lo retira durante 1 semana y luego se coloca uno nuevo. La paciente debe sentirse cmoda al insertar y retirar el anillo de la vagina.Es necesaria la prescripcin mdica.  Anticonceptivos de emergencia: son mtodos para evitar un embarazo despus de Cheryl Haynes relacin sexual sin proteccin. Esta pldora puede tomarse inmediatamente despus de Cheryl psychotherapisttener relaciones sexuales o hasta 5 Lafedas de haber tenido sexo sin proteccin. Es ms efectiva si se toma poco tiempo despus de la relacin sexual. Los anticonceptivos de emergencia estn disponibles sin prescripcin mdica. Consltelo con su farmacutico. No use los anticonceptivos de emergencia como nico mtodo anticonceptivo. MTODOS DE Cheryl NoonBARRERA  Condn masculino: es una vaina delgada (ltex o goma) que se coloca cubriendo al pene durante el acto sexual. Cheryl Fuellinguede usarse con espermicida para aumentar la efectividad.  Condn femenino. Es una funda delicada y blanda que se adapta holgadamente a la vagina antes de las Clinical research associaterelaciones sexuales.  Diafragma: es una barrera de ltex redonda y suave que debe ser recomendado por un profesional. Se inserta en la vagina, junto con un gel espermicida. Debe insertarse antes de Management consultanttener relaciones sexuales. Debe dejar el diafragma colocado en la vagina durante 6 a 8 horas despus de la relacin sexual.  Capuchn cervical: es una barrera de ltex o taza plstica redonda y Cheryl Haynes que cubre el cuello del tero y debe ser colocada por un mdico. Puede dejarlo colocado en la vagina hasta 48 horas despus de las Clinical research associaterelaciones sexuales.  Esponja: es una pieza blanda y circular de espuma de poliuretano.  Contiene un espermicida. Se inserta en la vagina despus de mojarla y antes de las [employer].  Espermicidas: son sustancias qumicas que matan o bloquean al esperma y no lo dejan ingresar al cuello del tero y al tero. Vienen en forma de cremas, geles, supositorios, espuma o comprimidos. No es necesario tener Emergency planning/management officer. Se insertan en la vagina con un aplicador antes de Management consultant. El proceso debe repetirse cada vez que tiene relaciones sexuales. ANTICONCEPTIVOS INTRAUTERINOS  Dispositivo intrauterino (DIU) es un dispositivo en forma de T que se coloca en el tero durante el perodo menstrual, para Location manager. Hay dos tipos:  DIU de cobre: este tipo de DIU est recubierto con un alambre de cobre y se inserta dentro del tero. El cobre hace que el tero y las trompas de Falopio produzcan un liquido que [employer] espermatozoides. Puede permanecer colocado durante 10 aos.  DIU con hormona: este tipo de DIU contiene la hormona progestina (progesterona sinttica). La hormona espesa el moco cervical y evita que los espermatozoides ingresen al tero y tambin afina la membrana que cubre el tero para evitar la implantacin del vulo fertilizado. La hormona debilita o destruye los espermatozoides que ingresan al tero. Puede Geneticist, molecular durante 3-5 aos, segn el tipo de DIU que se Eros. MTODOS ANTICONCEPTIVOS PERMANENTES  Ligadura de trompas en la mujer: se realiza sellando, atando u obstruyendo quirrgicamente las trompas de Falopio lo que impide que el vulo descienda hacia el tero.  Esterilizacin histeroscpica: Implica la colocacin de un pequeo espiral o la insercin en cada trompa de Falopio. El mdico utiliza una tcnica llamada histeroscopa para Primary school teacher procedimiento. El dispositivo produce la formacin de tejido Designer, television/film set. Esto da como resultado una obstruccin permanente de las trompas de Falopio, de modo que la esperma no pueda  fertilizar el vulo. Demora alrededor de 3 meses despus del procedimiento hasta que el conducto se obstruye. Tendr que usar otro mtodo anticonceptivo durante al menos 3 meses.  Esterilizacin masculina: se realiza ligando los conductos por los que pasan los espermatozoides (vasectoma).Esto impide que el esperma ingrese a la vagina durante el acto sexual. Luego del procedimiento, el hombre puede eyacular lquido (semen). MTODOS DE PLANIFICACIN NATURAL  Planificacin familiar natural: consiste en no Management consultant o usar un mtodo de barrera (condn, [place], capuchn cervical) en los [employer] la mujer podra quedar Walker.  Mtodo de calendario: consiste en el seguimiento de la duracin de cada ciclo menstrual y la identificacin de los perodos frtiles.  Mtodo de ovulacin: Paramedic las relaciones sexuales durante la ovulacin.  Mtodo sintotrmico: Advertising copywriter sexuales en la poca en la que se est ovulando, utilizando un termmetro y tendiendo en cuenta los sntomas de la ovulacin.  Mtodo postovulacin: Youth worker las relaciones sexuales para despus de haber ovulado. Independientemente del tipo o mtodo anticonceptivo que usted elija, es importante que use condones para protegerse contra las infecciones de transmisin sexual (ETS). Hable con su mdico con respecto a qu mtodo anticonceptivo es el ms apropiado para usted. Esta informacin no tiene Theme park manager el consejo del mdico. Asegrese de hacerle al mdico cualquier pregunta que tenga. Document Released: 10/25/2005 Document Revised: 06/27/2013 Document Reviewed: 04/19/2013 Elsevier Interactive Patient Education  2017 [employer].

## 2016-12-03 ENCOUNTER — Encounter: Payer: Self-pay | Admitting: Advanced Practice Midwife

## 2016-12-03 LAB — GLUCOSE TOLERANCE, 2 HOURS
Glucose, 2 hour: 132 mg/dL (ref <140)
Glucose, Fasting: 84 mg/dL (ref 65–99)

## 2018-05-04 ENCOUNTER — Encounter (HOSPITAL_COMMUNITY): Payer: Self-pay

## 2018-05-04 ENCOUNTER — Ambulatory Visit (HOSPITAL_COMMUNITY): Payer: Medicaid Other

## 2018-05-04 ENCOUNTER — Inpatient Hospital Stay (HOSPITAL_COMMUNITY)
Admission: EM | Admit: 2018-05-04 | Discharge: 2018-05-06 | DRG: 419 | Disposition: A | Discharge status: Home / Self Care | Payer: Medicaid Other | Attending: General Surgery | Admitting: General Surgery

## 2018-05-04 ENCOUNTER — Other Ambulatory Visit: Payer: Self-pay

## 2018-05-04 DIAGNOSIS — Z793 Long term (current) use of hormonal contraceptives: Secondary | ICD-10-CM

## 2018-05-04 DIAGNOSIS — Z79899 Other long term (current) drug therapy: Secondary | ICD-10-CM | POA: Diagnosis not present

## 2018-05-04 DIAGNOSIS — K810 Acute cholecystitis: Secondary | ICD-10-CM | POA: Diagnosis not present

## 2018-05-04 DIAGNOSIS — R1011 Right upper quadrant pain: Secondary | ICD-10-CM

## 2018-05-04 DIAGNOSIS — K429 Umbilical hernia without obstruction or gangrene: Secondary | ICD-10-CM | POA: Diagnosis present

## 2018-05-04 LAB — COMPREHENSIVE METABOLIC PANEL
ALK PHOS: 64 U/L (ref 38–126)
ALT: 46 U/L — AB (ref 0–44)
ANION GAP: 11 (ref 5–15)
AST: 25 U/L (ref 15–41)
Albumin: 4 g/dL (ref 3.5–5.0)
BUN: 5 mg/dL — ABNORMAL LOW (ref 6–20)
CO2: 23 mmol/L (ref 22–32)
Calcium: 8.8 mg/dL — ABNORMAL LOW (ref 8.9–10.3)
Chloride: 101 mmol/L (ref 98–111)
Creatinine, Ser: 0.44 mg/dL (ref 0.44–1.00)
GFR calc non Af Amer: >60 mL/min (ref >60)
Glucose, Bld: 143 mg/dL — ABNORMAL HIGH (ref 70–99)
Potassium: 3.2 mmol/L — ABNORMAL LOW (ref 3.5–5.1)
Sodium: 135 mmol/L (ref 135–145)
Total Bilirubin: 1.2 mg/dL (ref 0.3–1.2)
Total Protein: 7.9 g/dL (ref 6.5–8.1)

## 2018-05-04 LAB — SURGICAL PCR SCREEN
MRSA, PCR: NEGATIVE
STAPHYLOCOCCUS AUREUS: NEGATIVE

## 2018-05-04 LAB — URINALYSIS, ROUTINE W REFLEX MICROSCOPIC
Bilirubin Urine: NEGATIVE
Glucose, UA: NEGATIVE mg/dL
Ketones, ur: NEGATIVE mg/dL
Leukocytes, UA: NEGATIVE
Nitrite: NEGATIVE
PROTEIN: 100 mg/dL — AB
Specific Gravity, Urine: 1.011 (ref 1.005–1.030)
pH: 7 (ref 5.0–8.0)

## 2018-05-04 LAB — CBC
HEMATOCRIT: 38.2 % (ref 36.0–46.0)
Hemoglobin: 12.6 g/dL (ref 12.0–15.0)
MCH: 29.9 pg (ref 26.0–34.0)
MCHC: 33 g/dL (ref 30.0–36.0)
MCV: 90.7 fL (ref 78.0–100.0)
Platelets: 223 10*3/uL (ref 150–400)
RBC: 4.21 MIL/uL (ref 3.87–5.11)
RDW: 12.8 % (ref 11.5–15.5)
WBC: 16.3 10*3/uL — ABNORMAL HIGH (ref 4.0–10.5)

## 2018-05-04 LAB — LIPASE, BLOOD: Lipase: 33 U/L (ref 11–51)

## 2018-05-04 LAB — I-STAT BETA HCG BLOOD, ED (MC, WL, AP ONLY)

## 2018-05-04 MED ORDER — POTASSIUM CHLORIDE IN NACL 20-0.9 MEQ/L-% IV SOLN
INTRAVENOUS | Status: DC
  Administered 2018-05-04: 21:00:00 via INTRAVENOUS
  Filled 2018-05-04 (×2): qty 1000

## 2018-05-04 MED ORDER — SODIUM CHLORIDE 0.9 % IV BOLUS
1000.0000 mL | Freq: Once | INTRAVENOUS | Status: AC
  Administered 2018-05-04: 1000 mL via INTRAVENOUS

## 2018-05-04 MED ORDER — DIPHENHYDRAMINE HCL 25 MG PO CAPS: 25.0000 mg | ORAL_CAPSULE | Freq: Four times a day (QID) | ORAL | Status: DC | PRN

## 2018-05-04 MED ORDER — ONDANSETRON HCL 4 MG/2ML IJ SOLN
4.0000 mg | Freq: Once | INTRAMUSCULAR | Status: AC
  Administered 2018-05-04: 4 mg via INTRAVENOUS
  Filled 2018-05-04: qty 2

## 2018-05-04 MED ORDER — MORPHINE SULFATE (PF) 4 MG/ML IV SOLN
4.0000 mg | Freq: Once | INTRAVENOUS | Status: AC
  Administered 2018-05-04: 4 mg via INTRAVENOUS
  Filled 2018-05-04: qty 1

## 2018-05-04 MED ORDER — SODIUM CHLORIDE 0.9 % IV SOLN
2.0000 g | INTRAVENOUS | Status: DC
  Filled 2018-05-04: qty 20

## 2018-05-04 MED ORDER — ONDANSETRON 4 MG PO TBDP: 4.0000 mg | ORAL_TABLET | Freq: Four times a day (QID) | ORAL | Status: DC | PRN

## 2018-05-04 MED ORDER — DIPHENHYDRAMINE HCL 50 MG/ML IJ SOLN: 25.0000 mg | Freq: Four times a day (QID) | INTRAMUSCULAR | Status: DC | PRN

## 2018-05-04 MED ORDER — SODIUM CHLORIDE 0.9 % IV SOLN
2.0000 g | Freq: Once | INTRAVENOUS | Status: AC
  Administered 2018-05-04: 2 g via INTRAVENOUS
  Filled 2018-05-04: qty 20

## 2018-05-04 MED ORDER — MORPHINE SULFATE (PF) 2 MG/ML IV SOLN
2.0000 mg | INTRAVENOUS | Status: DC | PRN
  Administered 2018-05-04 – 2018-05-05 (×2): 4 mg via INTRAVENOUS
  Filled 2018-05-04 (×2): qty 2

## 2018-05-04 MED ORDER — ONDANSETRON HCL 4 MG/2ML IJ SOLN: 4.0000 mg | Freq: Four times a day (QID) | INTRAMUSCULAR | Status: DC | PRN

## 2018-05-04 NOTE — ED Notes (Signed)
Patient transported to Ultrasound 

## 2018-05-04 NOTE — ED Notes (Signed)
Patient transported to X-ray 

## 2018-05-04 NOTE — ED Notes (Signed)
Pt remains at US

## 2018-05-04 NOTE — ED Provider Notes (Signed)
[facility] EMERGENCY DEPARTMENT Provider Note   CSN: [id number] Arrival date & time: 05/04/18  1103     History   Chief Complaint Chief Complaint  Patient presents with  . Abdominal Pain   History obtained using in person interpreter HPI Cheryl Haynes is a 42 y.o. female who presents emergency department today for right upper quadrant abdominal pain that first began on Tuesday, 05/02/2018.  Patient reports after eating a fatty/greasy meal she started feeling pain in her right upper quadrant without radiation to her back, scapula or other portions of her abdomen.  She notes the pain was pressure in character and was associate with nausea as well as nonbilious, nonbloody emesis.  She reports the pain lasted for approximately 1 hour and was relieved after an episode of emesis.  She reports after eating a large or fatty meals this has reoccurred daily since onset.  She notes her last episode began after dinner last night and has continued into today.  She reports last episode of emesis was this morning at 1 AM.  She has been n.p.o. since yesterday after dinner.  Patient reports she has tried tea for her symptoms without any relief.  No other interventions prior to arrival.  She denies history of the same.  She did states she had one episode of nonbloody, non-melanous diarrhea on Monday but has otherwise had normal stools including one this morning.  No melena or hematochezia.  She still passing flatus.  She denies any previous abdominal surgeries.  She denies any fever, chills, chest pain, shortness of breath, cough, lower abdominal pain, flank pain, urinary frequency, urinary urgency, dysuria, hematuria.  She denies any chronic NSAID use or any alcohol use recently.  HPI  Past Medical History:  Diagnosis Date  . Depression    postpartum after 1st delivery  . Gestational diabetes mellitus (GDM), antepartum 05/31/2016   Early diagnosis 2017. Normal Postpartum 2 hour  GTT 2018.   . Varicose vein of leg    left leg    Patient Active Problem List   Diagnosis Date Noted  . BMI 38.0-38.9,adult 06/07/2016  . Language barrier 06/07/2016  . Gestational diabetes mellitus (GDM), antepartum 05/31/2016  . History of preterm delivery, currently pregnant 05/31/2016  . Obesity affecting pregnancy, antepartum 05/31/2016    Past Surgical History:  Procedure Laterality Date  . NO PAST SURGERIES       OB History    Gravida  5   Para  4   Term  3   Preterm  1   AB  1   Living  4     SAB  1   TAB  0   Ectopic  0   Multiple  0   Live Births  4            Home Medications    Prior to Admission medications   Medication Sig Start Date End Date Taking? Authorizing Provider  norelgestromin-ethinyl estradiol (ORTHO EVRA) 150-35 MCG/24HR transdermal patch Place 1 patch onto the skin once a week. 12/02/16   Katrinka Blazing, [place], CNM  norethindrone (MICRONOR,CAMILA,ERRIN) 0.35 MG tablet Take 1 tablet (0.35 mg total) by mouth daily. 12/02/16   Katrinka Blazing, [place], CNM  Prenatal Vit-Fe Fumarate-FA (MULTIVITAMIN-PRENATAL) 27-0.8 MG TABS tablet Take 1 tablet by mouth daily at 12 noon.    [provider]    Family History Family History  Problem Relation Age of Onset  . Diabetes Father   . Hypertension Father  Social History Social History   Tobacco Use  . Smoking status: Never Smoker  . Smokeless tobacco: Never Used  Substance Use Topics  . Alcohol use: No    Alcohol/week: 0.0 oz  . Drug use: No     Allergies   Patient has no known allergies.   Review of Systems Review of Systems  All other systems reviewed and are negative.    Physical Exam Updated Vital Signs BP 124/73   Pulse (!) 103   Temp 98.6 F (37 C) (Oral)   Resp (!) 23   LMP 04/19/2018   SpO2 99%   Physical Exam  Constitutional: She appears well-developed and well-nourished.  HENT:  Head: Normocephalic and atraumatic.  Right Ear: External ear normal.    Left Ear: External ear normal.  Nose: Nose normal.  Mouth/Throat: Uvula is midline, oropharynx is clear and moist and mucous membranes are normal. No tonsillar exudate.  Eyes: Pupils are equal, round, and reactive to light. Right eye exhibits no discharge. Left eye exhibits no discharge. No scleral icterus.  Neck: Trachea normal. Neck supple. No spinous process tenderness present. No neck rigidity. Normal range of motion present.  Cardiovascular: Normal rate, regular rhythm and intact distal pulses.  No murmur heard. Pulses:      Radial pulses are 2+ on the right side, and 2+ on the left side.       Dorsalis pedis pulses are 2+ on the right side, and 2+ on the left side.       Posterior tibial pulses are 2+ on the right side, and 2+ on the left side.  No lower extremity swelling or edema. Calves symmetric in size bilaterally.  Pulmonary/Chest: Effort normal and breath sounds normal. She exhibits no tenderness.  Abdominal: Soft. Bowel sounds are normal. She exhibits no distension. There is tenderness in the right upper quadrant and epigastric area. There is positive Murphy's sign. There is no rigidity, no rebound, no guarding and no CVA tenderness.  Musculoskeletal: She exhibits no edema.  Lymphadenopathy:    She has no cervical adenopathy.  Neurological: She is alert.  Skin: Skin is warm and dry. No rash noted. She is not diaphoretic.  No jaundice  Psychiatric: She has a normal mood and affect.  Nursing note and vitals reviewed.    ED Treatments / Results  Labs (all labs ordered are listed, but only abnormal results are displayed) Labs Reviewed  COMPREHENSIVE METABOLIC PANEL - Abnormal; Notable for the following components:      Result Value   Potassium 3.2 (*)    Glucose, Bld 143 (*)    BUN 5 (*)    Calcium 8.8 (*)    ALT 46 (*)    All other components within normal limits  CBC - Abnormal; Notable for the following components:   WBC 16.3 (*)    All other components within  normal limits  URINALYSIS, ROUTINE W REFLEX MICROSCOPIC - Abnormal; Notable for the following components:   APPearance HAZY (*)    Hgb urine dipstick SMALL (*)    Protein, ur 100 (*)    Bacteria, UA RARE (*)    All other components within normal limits  LIPASE, BLOOD  I-STAT BETA HCG BLOOD, ED ([facility], [facility], [facility] ONLY)    EKG EKG Interpretation  Date/Time:  Thursday May 04 2018 12:15:19 EDT Ventricular Rate:  104 PR Interval:    QRS Duration: 86 QT Interval:  351 QTC Calculation: 462 R Axis:   73 Text Interpretation:  Normal  sinus rhythm Borderline ST depression, lateral leads Baseline wander in lead(s) V3 V4 V5 V6 No old tracing to compare Confirmed by Mancel Bale [phone]) on 05/04/2018 2:21:34 PM   Radiology US Abdomen Limited Ruq  Result Date: 05/04/2018 CLINICAL DATA:  Right upper quadrant abdominal pain. EXAM: ULTRASOUND ABDOMEN LIMITED RIGHT UPPER QUADRANT COMPARISON:  None. FINDINGS: Gallbladder: Multiple dependent gallstones, largest measuring 2.5 cm. There are echogenic foci within the wall with ring down artifact consistent with cholesterolosis. Gallbladder wall is mildly thickened measuring 3.7 mm. No pericholecystic fluid. Patient is tender to transducer pressure over the gallbladder. Common bile duct: Diameter: 5 mm Liver: Heterogeneous increased parenchymal echogenicity. No mass or focal lesion. Portal vein is patent on color Doppler imaging with normal direction of blood flow towards the liver. IMPRESSION: 1. Gallstones with mild gallbladder wall thickening and a positive sonographic Murphy's sign. Findings support acute cholecystitis in the proper clinical setting. 2. Hepatic steatosis. Electronically Signed   By: Amie Portland M.D.   On: 05/04/2018 15:53    Procedures Procedures (including critical care time)  Medications Ordered in ED Medications  morphine 4 MG/ML injection 4 mg (4 mg Intravenous Given 05/04/18 1313)  sodium chloride 0.9 % bolus 1,000 mL (1,000 mLs  Intravenous New Bag/Given 05/04/18 1316)  ondansetron (ZOFRAN) injection 4 mg (4 mg Intravenous Given 05/04/18 1313)  morphine 4 MG/ML injection 4 mg (4 mg Intravenous Given 05/04/18 1614)     Initial Impression / Assessment and Plan / ED Course  I have reviewed the triage vital signs and the nursing notes.  Pertinent labs & imaging results that were available during my care of the patient were reviewed by me and considered in my medical decision making (see chart for details).     42 year old female presenting with right upper quadrant abdominal pain with associated nausea and vomiting after eating fatty foods. Abdominal exam with RUQ TTP. No peritoneal signs. Patient's right upper quadrant ultrasound with full multiple gallstones densities largest measuring (2.5 cm) with gallbladder wall thickening (3.7 mm) and positive Murphy sign is concerning for acute cholecystitis.  Correlating labs with a leukocytosis of 16.3 and elevation of ALT of 46.  Lipase and bilirubin within normal limits.  Common bile duct within normal limits.  Patient is without fever, jaundice, scleral icterus or hypotension.  Remaining lab work reviewed.  Patient has been n.p.o. since midnight yesterday.  Will consult surgery.  Pain is currently controlled after 2 doses of morphine.  Vital signs remained hemodynamically stable.  She is without any emesis in the department.   Appreciate Dr. Corliss Skains returning my consult and coming to see the patient.  He recommends antibiotics per ED order set. Abx ordered Patient updated on diagnosis. She appears safe for admission.  Final Clinical Impressions(s) / ED Diagnoses   Final diagnoses:  RUQ abdominal pain  Acute cholecystitis    ED Discharge Orders    None       Princella Pellegrini 05/04/18 1731    Mancel Bale, MD 05/05/18 1701

## 2018-05-04 NOTE — H&P (Signed)
Cheryl Haynes is an 42 y.o. female.   Chief Complaint: RUQ pain HPI: This is a 42 y.o. female who presents emergency department today for right upper quadrant abdominal pain that first began on Tuesday, 05/02/2018.  Patient reports after eating a fatty/greasy meal, she started feeling pain in her right upper quadrant without radiation to her back, scapula or other portions of her abdomen. She had nausea and vomiting.  She reports the pain lasted for approximately 1 hour and was relieved after an episode of emesis.  These symptoms have recurred over the last couple of days after eating.  She notes her last episode began after dinner last night and has continued into today.  She reports last episode of emesis was this morning at 1 AM.  She has been n.p.o. since yesterday after dinner.  Patient reports she has tried tea for her symptoms without any relief.  No other interventions prior to arrival.  No previous episodes prior to this we  She did states she had one episode of nonbloody, non-melanous diarrhea on Monday but has otherwise had normal stools including one this morning.  No melena or hematochezia.  She still passing flatus.  She denies any previous abdominal surgeries.  She denies any fever, chills, chest pain, shortness of breath, cough, lower abdominal pain, flank pain, urinary frequency, urinary urgency, dysuria, hematuria.  She denies any chronic NSAID use or any alcohol use recently.    Past Medical History:  Diagnosis Date  . Depression    postpartum after 1st delivery  . Gestational diabetes mellitus (GDM), antepartum 05/31/2016   Early diagnosis 2017. Normal Postpartum 2 hour GTT 2018.   . Varicose vein of leg    left leg    Past Surgical History:  Procedure Laterality Date  . NO PAST SURGERIES      Family History  Problem Relation Age of Onset  . Diabetes Father   . Hypertension Father    Social History:  reports that she has never smoked. She has never used  smokeless tobacco. She reports that she does not drink alcohol or use drugs.  Allergies: No Known Allergies  Prior to Admission medications   Medication Sig Start Date End Date Taking? Authorizing Provider  norelgestromin-ethinyl estradiol (ORTHO EVRA) 150-35 MCG/24HR transdermal patch Place 1 patch onto the skin once a week. Patient not taking: Reported on 05/04/2018 12/02/16   Tamala Julian, [place], CNM  norethindrone (MICRONOR,CAMILA,ERRIN) 0.35 MG tablet Take 1 tablet (0.35 mg total) by mouth daily. Patient not taking: Reported on 05/04/2018 12/02/16   Tamala Julian [place], CNM     Results for orders placed or performed during the hospital encounter of 05/04/18 (from the past 48 hour(s))  Urinalysis, Routine w reflex microscopic     Status: Abnormal   Collection Time: 05/04/18 11:14 AM  Result Value Ref Range   Color, Urine YELLOW YELLOW   APPearance HAZY (A) CLEAR   Specific Gravity, Urine 1.011 1.005 - 1.030   pH 7.0 5.0 - 8.0   Glucose, UA NEGATIVE NEGATIVE mg/dL   Hgb urine dipstick SMALL (A) NEGATIVE   Bilirubin Urine NEGATIVE NEGATIVE   Ketones, ur NEGATIVE NEGATIVE mg/dL   Protein, ur 100 (A) NEGATIVE mg/dL   Nitrite NEGATIVE NEGATIVE   Leukocytes, UA NEGATIVE NEGATIVE   RBC / HPF 0-5 0 - 5 RBC/hpf   WBC, UA 0-5 0 - 5 WBC/hpf   Bacteria, UA RARE (A) NONE SEEN   Squamous Epithelial / LPF 6-10 0 - 5   Mucus PRESENT  Comment: Performed at [facility], [place] [address] [place], [place] [address]  Lipase, blood     Status: None   Collection Time: 05/04/18 11:22 AM  Result Value Ref Range   Lipase 33 11 - 51 U/L    Comment: Performed at [facility] [address], [place], [place] [address]  Comprehensive metabolic panel     Status: Abnormal   Collection Time: 05/04/18 11:22 AM  Result Value Ref Range   Sodium 135 135 - 145 mmol/L   Potassium 3.2 (L) 3.5 - 5.1 mmol/L   Chloride 101 98 - 111 mmol/L    Comment: Please note change in reference range.   CO2 23 22 - 32  mmol/L   Glucose, Bld 143 (H) 70 - 99 mg/dL    Comment: Please note change in reference range.   BUN 5 (L) 6 - 20 mg/dL    Comment: Please note change in reference range.   Creatinine, Ser 0.44 0.44 - 1.00 mg/dL   Calcium 8.8 (L) 8.9 - 10.3 mg/dL   Total Protein 7.9 6.5 - 8.1 g/dL   Albumin 4.0 3.5 - 5.0 g/dL   AST 25 15 - 41 U/L   ALT 46 (H) 0 - 44 U/L    Comment: Please note change in reference range.   Alkaline Phosphatase 64 38 - 126 U/L   Total Bilirubin 1.2 0.3 - 1.2 mg/dL   GFR calc non Af Amer >60 >60 mL/min   GFR calc Af Amer >60 >60 mL/min    Comment: (NOTE) The eGFR has been calculated using the CKD EPI equation. This calculation has not been validated in all clinical situations. eGFR's persistently <60 mL/min signify possible Chronic Kidney Disease.    Anion gap 11 5 - 15    Comment: Performed at [facility] [address] [place], [place] [address]  CBC     Status: Abnormal   Collection Time: 05/04/18 11:22 AM  Result Value Ref Range   WBC 16.3 (H) 4.0 - 10.5 K/uL   RBC 4.21 3.87 - 5.11 MIL/uL   Hemoglobin 12.6 12.0 - 15.0 g/dL   HCT 38.2 36.0 - 46.0 %   MCV 90.7 78.0 - 100.0 fL   MCH 29.9 26.0 - 34.0 pg   MCHC 33.0 30.0 - 36.0 g/dL   RDW 12.8 11.5 - 15.5 %   Platelets 223 150 - 400 K/uL    Comment: Performed at [facility], [place] [address] [place], [place] [address]  I-Stat beta hCG blood, ED     Status: None   Collection Time: 05/04/18 11:27 AM  Result Value Ref Range   I-stat hCG, quantitative <5.0 <5 mIU/mL   Comment 3            Comment:   GEST. AGE      CONC.  (mIU/mL)   <=1 WEEK        5 - 50     2 WEEKS       50 - 500     3 WEEKS       100 - 10,000     4 WEEKS     1,000 - 30,000        FEMALE AND NON-PREGNANT FEMALE:     LESS THAN 5 mIU/mL    US Abdomen Limited Ruq  Result Date: 05/04/2018 CLINICAL DATA:  Right upper quadrant abdominal pain. EXAM: ULTRASOUND ABDOMEN LIMITED RIGHT UPPER QUADRANT COMPARISON:  None. FINDINGS:  Gallbladder: Multiple dependent gallstones, largest measuring 2.5 cm. There are echogenic foci within the wall with ring down artifact consistent with cholesterolosis. Gallbladder wall is mildly thickened measuring 3.7 mm. No pericholecystic fluid. Patient is tender to transducer pressure over the gallbladder. Common bile duct: Diameter: 5 mm Liver: Heterogeneous increased parenchymal echogenicity. No mass or focal lesion. Portal vein is patent on color Doppler imaging with normal direction of blood flow towards the liver. IMPRESSION: 1. Gallstones with mild gallbladder wall thickening and a positive sonographic Murphy's sign. Findings support acute cholecystitis in the proper clinical setting. 2. Hepatic steatosis. Electronically Signed   By: Lajean Manes M.D.   On: 05/04/2018 15:53    Review of Systems  Constitutional: Negative for weight loss.  HENT: Negative for ear discharge, ear pain, hearing loss and tinnitus.   Eyes: Negative for blurred vision, double vision, photophobia and pain.  Respiratory: Negative for cough, sputum production and shortness of breath.   Cardiovascular: Negative for chest pain.  Gastrointestinal: Positive for abdominal pain, diarrhea, nausea and vomiting.  Genitourinary: Negative for dysuria, flank pain, frequency and urgency.  Musculoskeletal: Negative for back pain, falls, joint pain, myalgias and neck pain.  Neurological: Negative for dizziness, tingling, sensory change, focal weakness, loss of consciousness and headaches.  Endo/Heme/Allergies: Does not bruise/bleed easily.  Psychiatric/Behavioral: Negative for depression, memory loss and substance abuse. The patient is not nervous/anxious.     Blood pressure 124/70, pulse 93, temperature 98.6 F (37 C), temperature source Oral, resp. rate (!) 24, last menstrual period 04/19/2018, SpO2 99 %, currently breastfeeding. Physical Exam  WDWN in NAD Eyes:  Pupils equal, round; sclera anicteric HENT:  Oral mucosa  moist; good dentition  Neck:  No masses palpated, no thyromegaly Lungs:  CTA bilaterally; normal respiratory effort CV:  Regular rate and rhythm; no murmurs; extremities well-perfused with no edema Abd:  +bowel sounds, soft, tender in RUQ, no palpable organomegaly; reducible umbilical hernia Skin:  Warm, dry; no sign of jaundice Psychiatric - alert and oriented x 4; calm mood and affect  Assessment/Plan Acute calculus cholecystitis Umbilical hernia  Plan laparoscopic cholecystectomy with intraoperative cholangiogram tomorrow.  The surgical procedure has been discussed with the patient.  Potential risks, benefits, alternative treatments, and expected outcomes have been explained.  All of the patient's questions at this time have been answered.  The likelihood of reaching the patient's treatment goal is good.  The patient understand the proposed surgical procedure and wishes to proceed.  Admit tonight for IV abx, NPO p MN  Maia Petties, MD 05/04/2018, 4:35 PM

## 2018-05-04 NOTE — Plan of Care (Signed)
  Problem: Clinical Measurements: Goal: Postoperative complications will be avoided or minimized Outcome: Progressing   

## 2018-05-04 NOTE — ED Triage Notes (Signed)
Pt reports RUQ abdominal pain for several days but worse today. She also reports vomiting this morning.

## 2018-05-04 NOTE — Progress Notes (Signed)
Patient admitted from ED, alert and oriented x4.Vital signs within normal limit. Skin intact. Pain 1/10. Oriented to room and call bell.

## 2018-05-04 NOTE — ED Notes (Signed)
Pt ambulated to restroom. 

## 2018-05-05 ENCOUNTER — Inpatient Hospital Stay (HOSPITAL_COMMUNITY): Payer: Medicaid Other | Admitting: Anesthesiology

## 2018-05-05 ENCOUNTER — Other Ambulatory Visit: Payer: Self-pay

## 2018-05-05 ENCOUNTER — Encounter (HOSPITAL_COMMUNITY): Payer: Self-pay | Admitting: Certified Registered"

## 2018-05-05 ENCOUNTER — Encounter (HOSPITAL_COMMUNITY): Admission: EM | Disposition: A | Discharge status: Home / Self Care | Payer: Self-pay | Source: Home / Self Care

## 2018-05-05 ENCOUNTER — Inpatient Hospital Stay (HOSPITAL_COMMUNITY): Payer: Medicaid Other

## 2018-05-05 HISTORY — PX: CHOLECYSTECTOMY: SHX55

## 2018-05-05 HISTORY — PX: UMBILICAL HERNIA REPAIR: SHX196

## 2018-05-05 LAB — HIV ANTIBODY (ROUTINE TESTING W REFLEX): HIV Screen 4th Generation wRfx: NONREACTIVE

## 2018-05-05 SURGERY — LAPAROSCOPIC CHOLECYSTECTOMY WITH INTRAOPERATIVE CHOLANGIOGRAM
Anesthesia: General | Site: Abdomen

## 2018-05-05 MED ORDER — DEXAMETHASONE SODIUM PHOSPHATE 10 MG/ML IJ SOLN
INTRAMUSCULAR | Status: AC
  Filled 2018-05-05: qty 1

## 2018-05-05 MED ORDER — DIPHENHYDRAMINE HCL 50 MG/ML IJ SOLN: 25.0000 mg | Freq: Four times a day (QID) | INTRAMUSCULAR | Status: DC | PRN

## 2018-05-05 MED ORDER — MIDAZOLAM HCL 2 MG/2ML IJ SOLN
INTRAMUSCULAR | Status: AC
  Filled 2018-05-05: qty 2

## 2018-05-05 MED ORDER — DIPHENHYDRAMINE HCL 25 MG PO CAPS: 25.0000 mg | ORAL_CAPSULE | Freq: Four times a day (QID) | ORAL | Status: DC | PRN

## 2018-05-05 MED ORDER — SUGAMMADEX SODIUM 200 MG/2ML IV SOLN
INTRAVENOUS | Status: AC
  Filled 2018-05-05: qty 2

## 2018-05-05 MED ORDER — BUPIVACAINE-EPINEPHRINE (PF) 0.25% -1:200000 IJ SOLN
INTRAMUSCULAR | Status: AC
  Filled 2018-05-05: qty 30

## 2018-05-05 MED ORDER — ONDANSETRON HCL 4 MG/2ML IJ SOLN
INTRAMUSCULAR | Status: AC
  Filled 2018-05-05: qty 2

## 2018-05-05 MED ORDER — SODIUM CHLORIDE 0.9 % IV SOLN
INTRAVENOUS | Status: DC | PRN
  Administered 2018-05-05: 30 mL

## 2018-05-05 MED ORDER — LACTATED RINGERS IV SOLN
INTRAVENOUS | Status: DC
  Administered 2018-05-05: 08:00:00 via INTRAVENOUS

## 2018-05-05 MED ORDER — SODIUM CHLORIDE 0.9 % IV SOLN
2.0000 g | INTRAVENOUS | Status: DC
  Administered 2018-05-05: 2 g via INTRAVENOUS
  Filled 2018-05-05 (×2): qty 20

## 2018-05-05 MED ORDER — ROCURONIUM BROMIDE 50 MG/5ML IV SOLN
INTRAVENOUS | Status: AC
Start: 2018-05-05 — End: ?
  Filled 2018-05-05: qty 1

## 2018-05-05 MED ORDER — OXYCODONE HCL 5 MG PO TABS
ORAL_TABLET | ORAL | Status: AC
  Filled 2018-05-05: qty 1

## 2018-05-05 MED ORDER — ONDANSETRON HCL 4 MG/2ML IJ SOLN
INTRAMUSCULAR | Status: DC | PRN
  Administered 2018-05-05: 4 mg via INTRAVENOUS

## 2018-05-05 MED ORDER — ONDANSETRON HCL 4 MG/2ML IJ SOLN: 4.0000 mg | Freq: Four times a day (QID) | INTRAMUSCULAR | Status: DC | PRN

## 2018-05-05 MED ORDER — MORPHINE SULFATE (PF) 2 MG/ML IV SOLN: 2.0000 mg | INTRAVENOUS | Status: DC | PRN

## 2018-05-05 MED ORDER — SODIUM CHLORIDE 0.9 % IR SOLN
Status: DC | PRN
  Administered 2018-05-05 (×2): 1000 mL

## 2018-05-05 MED ORDER — SUGAMMADEX SODIUM 200 MG/2ML IV SOLN
INTRAVENOUS | Status: DC | PRN
  Administered 2018-05-05: 200 mg via INTRAVENOUS

## 2018-05-05 MED ORDER — PHENYLEPHRINE 40 MCG/ML (10ML) SYRINGE FOR IV PUSH (FOR BLOOD PRESSURE SUPPORT)
PREFILLED_SYRINGE | INTRAVENOUS | Status: AC
  Filled 2018-05-05: qty 10

## 2018-05-05 MED ORDER — EPHEDRINE SULFATE 50 MG/ML IJ SOLN
INTRAMUSCULAR | Status: AC
  Filled 2018-05-05: qty 1

## 2018-05-05 MED ORDER — FENTANYL CITRATE (PF) 250 MCG/5ML IJ SOLN
INTRAMUSCULAR | Status: AC
  Filled 2018-05-05: qty 5

## 2018-05-05 MED ORDER — DEXAMETHASONE SODIUM PHOSPHATE 4 MG/ML IJ SOLN
INTRAMUSCULAR | Status: DC | PRN
  Administered 2018-05-05: 8 mg via INTRAVENOUS

## 2018-05-05 MED ORDER — BUPIVACAINE-EPINEPHRINE 0.25% -1:200000 IJ SOLN
INTRAMUSCULAR | Status: DC | PRN
  Administered 2018-05-05: 6 mL

## 2018-05-05 MED ORDER — LIDOCAINE 2% (20 MG/ML) 5 ML SYRINGE
INTRAMUSCULAR | Status: DC | PRN
  Administered 2018-05-05: 60 mg via INTRAVENOUS

## 2018-05-05 MED ORDER — 0.9 % SODIUM CHLORIDE (POUR BTL) OPTIME
TOPICAL | Status: DC | PRN
  Administered 2018-05-05: 1000 mL

## 2018-05-05 MED ORDER — HEMOSTATIC AGENTS (NO CHARGE) OPTIME
TOPICAL | Status: DC | PRN
  Administered 2018-05-05: 1 via TOPICAL

## 2018-05-05 MED ORDER — GABAPENTIN 300 MG PO CAPS
300.0000 mg | ORAL_CAPSULE | Freq: Two times a day (BID) | ORAL | Status: DC
  Administered 2018-05-05 (×2): 300 mg via ORAL
  Filled 2018-05-05 (×2): qty 1

## 2018-05-05 MED ORDER — OXYCODONE HCL 5 MG PO TABS
5.0000 mg | ORAL_TABLET | ORAL | Status: DC | PRN
  Administered 2018-05-05 (×2): 5 mg via ORAL
  Filled 2018-05-05: qty 1

## 2018-05-05 MED ORDER — ROCURONIUM BROMIDE 100 MG/10ML IV SOLN
INTRAVENOUS | Status: DC | PRN
  Administered 2018-05-05: 10 mg via INTRAVENOUS
  Administered 2018-05-05: 30 mg via INTRAVENOUS
  Administered 2018-05-05: 10 mg via INTRAVENOUS

## 2018-05-05 MED ORDER — ACETAMINOPHEN 650 MG RE SUPP: 650.0000 mg | Freq: Four times a day (QID) | RECTAL | Status: DC | PRN

## 2018-05-05 MED ORDER — POTASSIUM CHLORIDE IN NACL 20-0.9 MEQ/L-% IV SOLN
INTRAVENOUS | Status: DC
  Administered 2018-05-05: 11:00:00 via INTRAVENOUS

## 2018-05-05 MED ORDER — PROPOFOL 10 MG/ML IV BOLUS
INTRAVENOUS | Status: AC
Start: 2018-05-05 — End: ?
  Filled 2018-05-05: qty 20

## 2018-05-05 MED ORDER — IOPAMIDOL (ISOVUE-300) INJECTION 61%
INTRAVENOUS | Status: AC
Start: 2018-05-05 — End: ?
  Filled 2018-05-05: qty 50

## 2018-05-05 MED ORDER — TRAMADOL HCL 50 MG PO TABS: 50.0000 mg | ORAL_TABLET | Freq: Four times a day (QID) | ORAL | Status: DC | PRN

## 2018-05-05 MED ORDER — FENTANYL CITRATE (PF) 100 MCG/2ML IJ SOLN
INTRAMUSCULAR | Status: DC | PRN
  Administered 2018-05-05: 150 ug via INTRAVENOUS
  Administered 2018-05-05: 50 ug via INTRAVENOUS
  Administered 2018-05-05: 100 ug via INTRAVENOUS
  Administered 2018-05-05: 50 ug via INTRAVENOUS

## 2018-05-05 MED ORDER — LIDOCAINE 2% (20 MG/ML) 5 ML SYRINGE
INTRAMUSCULAR | Status: AC
  Filled 2018-05-05: qty 5

## 2018-05-05 MED ORDER — HYDROMORPHONE HCL 1 MG/ML IJ SOLN: 0.2500 mg | INTRAMUSCULAR | Status: DC | PRN

## 2018-05-05 MED ORDER — ENOXAPARIN SODIUM 40 MG/0.4ML ~~LOC~~ SOLN
40.0000 mg | SUBCUTANEOUS | Status: DC
  Administered 2018-05-06: 40 mg via SUBCUTANEOUS
  Filled 2018-05-05: qty 0.4

## 2018-05-05 MED ORDER — PROPOFOL 10 MG/ML IV BOLUS
INTRAVENOUS | Status: DC | PRN
  Administered 2018-05-05: 130 mg via INTRAVENOUS

## 2018-05-05 MED ORDER — PHENYLEPHRINE 40 MCG/ML (10ML) SYRINGE FOR IV PUSH (FOR BLOOD PRESSURE SUPPORT)
PREFILLED_SYRINGE | INTRAVENOUS | Status: DC | PRN
  Administered 2018-05-05: 40 ug via INTRAVENOUS

## 2018-05-05 MED ORDER — LACTATED RINGERS IV SOLN
INTRAVENOUS | Status: DC | PRN
  Administered 2018-05-05 (×2): via INTRAVENOUS

## 2018-05-05 MED ORDER — ROCURONIUM BROMIDE 50 MG/5ML IV SOLN
INTRAVENOUS | Status: AC
  Filled 2018-05-05: qty 1

## 2018-05-05 MED ORDER — MIDAZOLAM HCL 5 MG/5ML IJ SOLN
INTRAMUSCULAR | Status: DC | PRN
  Administered 2018-05-05: 2 mg via INTRAVENOUS

## 2018-05-05 MED ORDER — ACETAMINOPHEN 325 MG PO TABS: 650.0000 mg | ORAL_TABLET | Freq: Four times a day (QID) | ORAL | Status: DC | PRN

## 2018-05-05 MED ORDER — ONDANSETRON 4 MG PO TBDP: 4.0000 mg | ORAL_TABLET | Freq: Four times a day (QID) | ORAL | Status: DC | PRN

## 2018-05-05 SURGICAL SUPPLY — 57 items
APPLIER CLIP ROT 10 11.4 M/L (STAPLE) ×3
BENZOIN TINCTURE PRP APPL 2/3 (GAUZE/BANDAGES/DRESSINGS) ×3 IMPLANT
BLADE CLIPPER SURG (BLADE) IMPLANT
CANISTER SUCT 3000ML PPV (MISCELLANEOUS) ×3 IMPLANT
CHLORAPREP W/TINT 26ML (MISCELLANEOUS) ×3 IMPLANT
CLIP APPLIE ROT 10 11.4 M/L (STAPLE) ×1 IMPLANT
CLOSURE WOUND 1/2 X4 (GAUZE/BANDAGES/DRESSINGS) ×1
COVER MAYO STAND STRL (DRAPES) ×3 IMPLANT
COVER SURGICAL LIGHT HANDLE (MISCELLANEOUS) ×3 IMPLANT
DRAPE C-ARM 42X72 X-RAY (DRAPES) ×3 IMPLANT
DRSG TEGADERM 2-3/8X2-3/4 SM (GAUZE/BANDAGES/DRESSINGS) ×9 IMPLANT
DRSG TEGADERM 4X4.75 (GAUZE/BANDAGES/DRESSINGS) ×3 IMPLANT
ELECT CAUTERY BLADE 6.4 (BLADE) ×3 IMPLANT
ELECT REM PT RETURN 9FT ADLT (ELECTROSURGICAL) ×3
ELECTRODE REM PT RTRN 9FT ADLT (ELECTROSURGICAL) ×1 IMPLANT
FILTER SMOKE EVAC LAPAROSHD (FILTER) ×3 IMPLANT
GAUZE SPONGE 2X2 8PLY STRL LF (GAUZE/BANDAGES/DRESSINGS) ×1 IMPLANT
GLOVE BIO SURGEON STRL SZ7 (GLOVE) ×3 IMPLANT
GLOVE BIO SURGEON STRL SZ7.5 (GLOVE) ×3 IMPLANT
GLOVE BIO SURGEON STRL SZ8 (GLOVE) ×3 IMPLANT
GLOVE BIOGEL PI IND STRL 7.5 (GLOVE) ×2 IMPLANT
GLOVE BIOGEL PI IND STRL 8 (GLOVE) ×1 IMPLANT
GLOVE BIOGEL PI INDICATOR 7.5 (GLOVE) ×4
GLOVE BIOGEL PI INDICATOR 8 (GLOVE) ×2
GLOVE SURG SS PI 6.5 STRL IVOR (GLOVE) ×3 IMPLANT
GOWN STRL REUS W/ TWL LRG LVL3 (GOWN DISPOSABLE) ×3 IMPLANT
GOWN STRL REUS W/ TWL XL LVL3 (GOWN DISPOSABLE) ×2 IMPLANT
GOWN STRL REUS W/TWL LRG LVL3 (GOWN DISPOSABLE) ×6
GOWN STRL REUS W/TWL XL LVL3 (GOWN DISPOSABLE) ×4
HEMOSTAT SNOW SURGICEL 2X4 (HEMOSTASIS) ×3 IMPLANT
KIT BASIN OR (CUSTOM PROCEDURE TRAY) ×3 IMPLANT
KIT TURNOVER KIT B (KITS) ×3 IMPLANT
NS IRRIG 1000ML POUR BTL (IV SOLUTION) ×3 IMPLANT
PAD ARMBOARD 7.5X6 YLW CONV (MISCELLANEOUS) ×6 IMPLANT
PENCIL BUTTON HOLSTER BLD 10FT (ELECTRODE) ×3 IMPLANT
POUCH RETRIEVAL ECOSAC 10 (ENDOMECHANICALS) ×1 IMPLANT
POUCH RETRIEVAL ECOSAC 10MM (ENDOMECHANICALS) ×2
POUCH SPECIMEN RETRIEVAL 10MM (ENDOMECHANICALS) IMPLANT
SCISSORS LAP 5X35 DISP (ENDOMECHANICALS) ×3 IMPLANT
SET CHOLANGIOGRAPH 5 50 .035 (SET/KITS/TRAYS/PACK) ×3 IMPLANT
SET IRRIG TUBING LAPAROSCOPIC (IRRIGATION / IRRIGATOR) ×3 IMPLANT
SLEEVE ENDOPATH XCEL 5M (ENDOMECHANICALS) ×3 IMPLANT
SPECIMEN JAR SMALL (MISCELLANEOUS) ×3 IMPLANT
SPONGE GAUZE 2X2 STER 10/PKG (GAUZE/BANDAGES/DRESSINGS) ×2
STRIP CLOSURE SKIN 1/2X4 (GAUZE/BANDAGES/DRESSINGS) ×2 IMPLANT
SUT MNCRL AB 4-0 PS2 18 (SUTURE) ×3 IMPLANT
SUT NOVA NAB GS-21 0 18 T12 DT (SUTURE) ×3 IMPLANT
SUT VIC AB 3-0 SH 27 (SUTURE) ×2
SUT VIC AB 3-0 SH 27XBRD (SUTURE) ×1 IMPLANT
TOWEL OR 17X24 6PK STRL BLUE (TOWEL DISPOSABLE) ×3 IMPLANT
TOWEL OR 17X26 10 PK STRL BLUE (TOWEL DISPOSABLE) IMPLANT
TRAY LAPAROSCOPIC MC (CUSTOM PROCEDURE TRAY) ×3 IMPLANT
TROCAR XCEL BLUNT TIP 100MML (ENDOMECHANICALS) ×3 IMPLANT
TROCAR XCEL NON-BLD 11X100MML (ENDOMECHANICALS) ×3 IMPLANT
TROCAR XCEL NON-BLD 5MMX100MML (ENDOMECHANICALS) ×3 IMPLANT
TUBING INSUFFLATION (TUBING) ×3 IMPLANT
WATER STERILE IRR 1000ML POUR (IV SOLUTION) ×3 IMPLANT

## 2018-05-05 NOTE — Anesthesia Procedure Notes (Addendum)
Procedure Name: Intubation Date/Time: 05/05/2018 8:25 AM Performed by: Julian ReilWelty, Constantine Ruddick F, CRNA Pre-anesthesia Checklist: Patient identified, Emergency Drugs available, Suction available, Patient being monitored and Timeout performed Patient Re-evaluated:Patient Re-evaluated prior to induction Oxygen Delivery Method: Circle system utilized Preoxygenation: Pre-oxygenation with 100% oxygen Induction Type: IV induction Ventilation: Mask ventilation without difficulty Laryngoscope Size: Miller and 3 Grade View: Grade I Tube type: Oral Tube size: 7.0 mm Number of attempts: 1 Airway Equipment and Method: Stylet Placement Confirmation: ETT inserted through vocal cords under direct vision,  positive ETCO2 and breath sounds checked- equal and bilateral Secured at: 22 cm Tube secured with: Tape Dental Injury: Teeth and Oropharynx as per pre-operative assessment  Comments: 4x4s bite block used.

## 2018-05-05 NOTE — Op Note (Signed)
Laparoscopic Cholecystectomy with IOC/ umbilical hernia repair Procedure Note  Indications: This patient presents with symptomatic gallbladder disease and will undergo laparoscopic cholecystectomy.  Pre-operative Diagnosis: Calculus of gallbladder with acute cholecystitis, without mention of obstruction  Post-operative Diagnosis: Calculus of gallbladder with acute cholecystitis, without mention of obstruction; choledocholithiasis; umbilical hernia  Surgeon: Wynona LunaMatthew K Merci Walthers   Assistants: Dr. Violeta GelinasBurke Thompson  Anesthesia: General endotracheal anesthesia  ASA Class: 2  Procedure Details  The patient was seen again in the Holding Room. The risks, benefits, complications, treatment options, and expected outcomes were discussed with the patient. The possibilities of reaction to medication, pulmonary aspiration, perforation of viscus, bleeding, recurrent infection, finding a normal gallbladder, the need for additional procedures, failure to diagnose a condition, the possible need to convert to an open procedure, and creating a complication requiring transfusion or operation were discussed with the patient. The likelihood of improving the patient's symptoms with return to their baseline status is good.  The patient and/or family concurred with the proposed plan, giving informed consent. The site of surgery properly noted. The patient was taken to Operating Room, identified as Cheryl Haynes and the procedure verified as Laparoscopic Cholecystectomy with Intraoperative Cholangiogram. A Time Out was held and the above information confirmed.  Prior to the induction of general anesthesia, antibiotic prophylaxis was administered. General endotracheal anesthesia was then administered and tolerated well. After the induction, the abdomen was prepped with Chloraprep and draped in the sterile fashion. The patient was positioned in the supine position.  Local anesthetic agent was injected into the skin  above the umbilicus and an incision made.  The patient has a small umbilical hernia with some herniated preperitoneal fat.  I dissected around the hernia sac.  We opened the hernia sac and entered the peritoneal cavity.  A pursestring suture of 0-Vicryl was placed around the fascial opening.  The Hasson cannula was inserted and secured with the stay suture.  Pneumoperitoneum was then created with CO2 and tolerated well without any adverse changes in the patient's vital signs. An 11-mm port was placed in the subxiphoid position.  Two 5-mm ports were placed in the right upper quadrant. All skin incisions were infiltrated with a local anesthetic agent before making the incision and placing the trocars.   We positioned the patient in reverse Trendelenburg, tilted slightly to the patient's left.  The colon is adherent to the gallbladder.  We bluntly dissected this away and identified a very distended, thickened, edematous, erythematous gallbladder.  The gallbladder was decompressed with the suction aspirator, the fundus grasped and retracted cephalad. Several tiny stones were spilled through the opening from the suction aspirator, but these were picked up and removed.  The gallbladder is very long and inflamed.  We carefully dissected down around the hilum of the gallbladder.  Adhesions were lysed bluntly and with the electrocautery where indicated, taking care not to injure any adjacent organs or viscus. The infundibulum was grasped and retracted laterally, exposing the peritoneum overlying the triangle of Calot. This was then divided and exposed in a blunt fashion.   The cystic duct was clearly identified and bluntly dissected circumferentially. The cystic duct was ligated with a clip distally.   An incision was made in the cystic duct and the [facility] introduced. The catheter was secured using a clip. A cholangiogram was then obtained which showed good visualization of the distal and proximal  biliary tree with no sign of obstruction.  Contrast flowed easily into the duodenum. 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  A single filling defect is seen proximally in the biliary tree near the bifurcation, but there is no sign of obstruction.  The catheter was then removed.   The cystic duct was then ligated with clips and divided. The cystic artery was identified, dissected free, ligated with clips and divided as well.   The gallbladder was dissected from the liver bed in retrograde fashion with the electrocautery. The gallbladder was removed and placed in an Eco sac. The liver bed was irrigated and inspected. Hemostasis was achieved with the electrocautery and Surgicel SNOW.  Copious irrigation was utilized and was repeatedly aspirated until clear.  The gallbladder and Eco sac were then removed through the umbilical port site.   We again inspected the right upper quadrant for hemostasis.  Pneumoperitoneum was released as we removed the trocars.    We dissected the hernia sac away from the fascia at the umbilicus.  The hernia defect was closed with interrupted 0 Novofil sutures.  3-0 vicryl was used to close the subcutaneous tissue and 4-0 Monocryl was used to close the skin.   Benzoin, steri-strips, and clean dressings were applied. The patient was then extubated and brought to the recovery room in stable condition. Instrument, sponge, and needle counts were correct at closure and at the conclusion of the case.   Findings: Cholecystitis with Cholelithiasis; single CBD stone near bifurcation   Estimated Blood Loss: less than 100 mL         Drains: none         Specimens: Gallbladder           Complications: None; patient tolerated the procedure well.         Disposition: PACU - hemodynamically stable.         Condition: stable  Wilmon Arms. Corliss Skains, MD, [facility]  General/ Trauma Surgery  05/05/2018 10:14 AM

## 2018-05-05 NOTE — Progress Notes (Signed)
   Subjective/Chief Complaint: Mild RUQ tenderness No nausea   Objective: Vital signs in last 24 hours: Temp:  [98.6 F (37 C)-99.2 F (37.3 C)] 99 F (37.2 C) (06/28 0506) Pulse Rate:  [93-116] 113 (06/28 0506) Resp:  [12-26] 18 (06/28 0506) BP: (93-143)/(52-96) 128/60 (06/28 0506) SpO2:  [98 %-100 %] 98 % (06/28 0506) Weight:  [67.8 kg (149 lb 7.6 oz)] 67.8 kg (149 lb 7.6 oz) (06/27 2005) Last BM Date: 05/04/18  Intake/Output from previous day: 06/27 0701 - 06/28 0700 In: 1905.1 [I.V.:805.1; IV Piggyback:1100] Out: -  Intake/Output this shift: No intake/output data recorded.  Mild RUQ tenderness  Lab Results:  Recent Labs    05/04/18 1122  WBC 16.3*  HGB 12.6  HCT 38.2  PLT 223   BMET Recent Labs    05/04/18 1122  NA 135  Haynes 3.2*  CL 101  CO2 23  GLUCOSE 143*  BUN 5*  CREATININE 0.44  CALCIUM 8.8*   PT/INR No results for input(s): LABPROT, INR in the last 72 hours. ABG No results for input(s): PHART, HCO3 in the last 72 hours.  Invalid input(s): PCO2, PO2  Studies/Results: Kore[place] Abdomen Limited Ruq  Result Date: 05/04/2018 CLINICAL DATA:  Right upper quadrant abdominal pain. EXAM: ULTRASOUND ABDOMEN LIMITED RIGHT UPPER QUADRANT COMPARISON:  None. FINDINGS: Gallbladder: Multiple dependent gallstones, largest measuring 2.5 cm. There are echogenic foci within the wall with ring down artifact consistent with cholesterolosis. Gallbladder wall is mildly thickened measuring 3.7 mm. No pericholecystic fluid. Patient is tender to transducer pressure over the gallbladder. Common bile duct: Diameter: 5 mm Liver: Heterogeneous increased parenchymal echogenicity. No mass or focal lesion. Portal vein is patent on color Doppler imaging with normal direction of blood flow towards the liver. IMPRESSION: 1. Gallstones with mild gallbladder wall thickening and a positive sonographic Murphy's sign. Findings support acute cholecystitis in the proper clinical setting. 2. Hepatic  steatosis. Electronically Signed   By: Amie Portlandavid  Ormond M.D.   On: 05/04/2018 15:53    Anti-infectives: Anti-infectives (From admission, onward)   Start     Dose/Rate Route Frequency Ordered Stop   05/05/18 1700  cefTRIAXone (ROCEPHIN) 2 g in sodium chloride 0.9 % 100 mL IVPB     2 g 200 mL/hr over 30 Minutes Intravenous Every 24 hours 05/04/18 2021     05/04/18 1645  cefTRIAXone (ROCEPHIN) 2 g in sodium chloride 0.9 % 100 mL IVPB     2 g 200 mL/hr over 30 Minutes Intravenous  Once 05/04/18 1633 05/04/18 1836      Assessment/Plan: Acute calculus cholecystitis  Plan laparoscopic cholecystectomy with cholangiogram today.  We will also repair her umbilical hernia at the end of the case.  The surgical procedure has been discussed with the patient.  Potential risks, benefits, alternative treatments, and expected outcomes have been explained.  All of the patient's questions at this time have been answered.  The likelihood of reaching the patient's treatment goal is good.  The patient understand the proposed surgical procedure and wishes to proceed.   LOS: 1 day    Cheryl LunaMatthew Haynes Cheryl Haynes 05/05/2018

## 2018-05-05 NOTE — Discharge Instructions (Signed)
CIRUGIA LAPAROSCOPICA: INSTRUCCIONES DE POST OPERATORIO. ° °Revise siempre los documentos que le entreguen en el lugar donde se ha hecho la cirugia. ° °SI USTED NECESITA DOCUMENTOS DE INCAPACIDAD (DISABLE) O DE PERMISO FAMILAR (FAMILY LEAVE) NECESITA TRAERLOS A LA OFICINA PARA QUE SEAN PROCESADOS. °NO  SE LOS DE A SU DOCTOR. °1. A su alta del hospital se le dara una receta para controlar el dolor. Tomela como ha sido recetada, si la necesita. Si no la necesita puede tomar, Acetaminofen (Tylenol) o Ibuprofen (Advil) para aliviar dolor moderado. °2. Continue tomando el resto de sus medicinas. °3. Si necesita rellenar la receta, llame a la farmacia. ellos contactan a nuestra oficina pidiendo autorizacion. Este tipo de receta no pueden ser rellenadas despues de las  5pm o durante los fines de semana. °4. Con relacion a la dieta: debe ser ligera los primeros dias despues que llege a la casa. Ejemplo: sopas y galleticas. Tome bastante liquido esos dias. °5. La mayoria de los pacientes padecen de inflamacion y cambio de coloracion de la piel alrededor de las incisiones. esto toma dias en resolver.  pnerse una bolsa de hielo en el area affectada ayuda..  °6. Es comun tambien tener un poco de estrenimiento si esta tomado medicinas para el dolor. incremente la cantidad de liquidos a tomar y puede tomar (Colace) esto previene el problema. Si ya tiene estrenimiento, es decir no ha defecado en 48 horas, puede tomar un laxativo (Milk of Magnesia or Miralax) uselo como el paquete le explica. °7.  A menos que se le diga algo diferente. Remueva el bendaje a las 24-48 horas despues dela cirugia. y puede banarse en la ducha sin ningun problema. usted puede tener steri-strips (pequenas curitas transparentes en la piel puesta encima de la incision)  Estas banditas strips should be left on the skin for 7-10 days.   Si su cirujano puso pegamento encima de la incision usted puede banarse bajo la ducha en 24 horas. Este pegamento empezara a  caerse en las proximas 2-3 semanas. Si le pusieron suturas o presillas (grapos) estos seran quitados en su proxima cita en la oficina. . °a. ACTIVIDADES:  Puede hacer actividad ligera.  Como caminar , subir escaleras y poco a poco irlas incrementando tanto como las tolere. Puede tener relaciones sexuales cuando sea comfortable. No carge objetos pesados o haga esfuerzos que no sean aprovados por su doctor. °b. Puede manejar en cuanto no esta tomando medicamentos fuertes (narcoticos) para el dolor, pueda abrochar confortablemente el cinturon de seguridad, y pueda maniobrar y usar los pedales de su vehiculo con seguridad. °c. PUEDE REGRESAR A TRABAJAR  °8. Debe ver a su doctor para una cita de seguimiento en 2-3 semanas despues de la cirugia.  °9. OTRAS ISNSTRUCCIONES:___________________________________________________________________________________ °CUANDO LLAMAR A SU MEDICO: °1. FIEBRE mayor de  101.0 °2. No produccion de orina. °3. Sangramiento continue de la herida °4. Incremento de dolor, enrojecimientio o drenaje de la herida (incision) °5. Incremento de dolor abdominal. ° °The clinic staff is available to answer your questions during regular business hours.  Please don’t hesitate to call and ask to speak to one of the nurses for clinical concerns.  If you have a medical emergency, go to the nearest emergency room or call 911.  A surgeon from [facility] is always on call at the hospital. °[address], [address], [place], [place]  [address] ? [address] [address], [place],    [address] °[phone] ? [phone] ? FAX [phone] °Web site: [email or web address] ° ° °

## 2018-05-05 NOTE — Anesthesia Postprocedure Evaluation (Signed)
Anesthesia Post Note  Patient: Cheryl Haynes  Procedure(s) Performed: LAPAROSCOPIC CHOLECYSTECTOMY WITH INTRAOPERATIVE CHOLANGIOGRAM (N/A Abdomen) HERNIA REPAIR UMBILICAL ADULT (N/A Abdomen)     Patient location during evaluation: PACU Anesthesia Type: General Level of consciousness: awake and alert Pain management: pain level controlled Vital Signs Assessment: post-procedure vital signs reviewed and stable Respiratory status: spontaneous breathing, nonlabored ventilation and respiratory function stable Cardiovascular status: blood pressure returned to baseline and stable Postop Assessment: no apparent nausea or vomiting Anesthetic complications: no    Last Vitals:  Vitals:   05/05/18 1109 05/05/18 1132  BP: 114/63 116/61  Pulse: (!) 103 (!) 102  Resp: (!) 26 19  Temp: 37.1 C 37.7 C  SpO2: 97% 97%    Last Pain:  Vitals:   05/05/18 1132  TempSrc: Oral  PainSc:                  Rustin Erhart,W. EDMOND

## 2018-05-05 NOTE — Transfer of Care (Signed)
Immediate Anesthesia Transfer of Care Note  Patient: Cheryl Haynes  Procedure(s) Performed: LAPAROSCOPIC CHOLECYSTECTOMY WITH INTRAOPERATIVE CHOLANGIOGRAM (N/A Abdomen) HERNIA REPAIR UMBILICAL ADULT (N/A Abdomen)  Patient Location: PACU  Anesthesia Type:General  Level of Consciousness: awake and patient cooperative  Airway & Oxygen Therapy: Patient Spontanous Breathing and Patient connected to face mask oxygen  Post-op Assessment: Report given to RN and Post -op Vital signs reviewed and stable  Post vital signs: Reviewed and stable  Last Vitals:  Vitals Value Taken Time  BP 96/77 05/05/2018 10:39 AM  Temp    Pulse 102 05/05/2018 10:40 AM  Resp 23 05/05/2018 10:40 AM  SpO2 100 % 05/05/2018 10:40 AM  Vitals shown include unvalidated device data.  Last Pain:  Vitals:   05/05/18 0506  TempSrc: Oral  PainSc:          Complications: No apparent anesthesia complications

## 2018-05-05 NOTE — Anesthesia Preprocedure Evaluation (Addendum)
Anesthesia Evaluation  Patient identified by MRN, date of birth, ID band Patient awake    Reviewed: Allergy & Precautions, H&P , NPO status , Patient's Chart, lab work & pertinent test results  Airway Mallampati: II  TM Distance: >3 FB Neck ROM: Full    Dental no notable dental hx. (+) Teeth Intact, Dental Advisory Given   Pulmonary neg pulmonary ROS,    Pulmonary exam normal breath sounds clear to auscultation       Cardiovascular negative cardio ROS   Rhythm:Regular Rate:Normal     Neuro/Psych negative neurological ROS  negative psych ROS   GI/Hepatic negative GI ROS, Neg liver ROS,   Endo/Other  negative endocrine ROSdiabetes  Renal/GU negative Renal ROS  negative genitourinary   Musculoskeletal   Abdominal   Peds  Hematology negative hematology ROS (+)   Anesthesia Other Findings   Reproductive/Obstetrics negative OB ROS                            Anesthesia Physical Anesthesia Plan  ASA: I  Anesthesia Plan: General   Post-op Pain Management:    Induction: Intravenous  PONV Risk Score and Plan: 4 or greater and Ondansetron, Dexamethasone and Midazolam  Airway Management Planned: Oral ETT  Additional Equipment:   Intra-op Plan:   Post-operative Plan: Extubation in OR  Informed Consent: I have reviewed the patients History and Physical, chart, labs and discussed the procedure including the risks, benefits and alternatives for the proposed anesthesia with the patient or authorized representative who has indicated his/her understanding and acceptance.   Dental advisory given  Plan Discussed with: CRNA  Anesthesia Plan Comments:         Anesthesia Quick Evaluation

## 2018-05-06 ENCOUNTER — Encounter (HOSPITAL_COMMUNITY): Payer: Self-pay | Admitting: Surgery

## 2018-05-06 LAB — COMPREHENSIVE METABOLIC PANEL
ALK PHOS: 57 U/L (ref 38–126)
ALT: 63 U/L — AB (ref 0–44)
AST: 36 U/L (ref 15–41)
Albumin: 2.7 g/dL — ABNORMAL LOW (ref 3.5–5.0)
Anion gap: 6 (ref 5–15)
BUN: 10 mg/dL (ref 6–20)
CHLORIDE: 107 mmol/L (ref 98–111)
CO2: 23 mmol/L (ref 22–32)
CREATININE: 0.43 mg/dL — AB (ref 0.44–1.00)
Calcium: 7.6 mg/dL — ABNORMAL LOW (ref 8.9–10.3)
GLUCOSE: 129 mg/dL — AB (ref 70–99)
POTASSIUM: 3.4 mmol/L — AB (ref 3.5–5.1)
Sodium: 136 mmol/L (ref 135–145)
Total Bilirubin: 1 mg/dL (ref 0.3–1.2)
Total Protein: 6.3 g/dL — ABNORMAL LOW (ref 6.5–8.1)

## 2018-05-06 LAB — CBC
HEMATOCRIT: 31.4 % — AB (ref 36.0–46.0)
Hemoglobin: 10.1 g/dL — ABNORMAL LOW (ref 12.0–15.0)
MCH: 29.8 pg (ref 26.0–34.0)
MCHC: 32.2 g/dL (ref 30.0–36.0)
MCV: 92.6 fL (ref 78.0–100.0)
PLATELETS: 204 10*3/uL (ref 150–400)
RBC: 3.39 MIL/uL — ABNORMAL LOW (ref 3.87–5.11)
RDW: 13.2 % (ref 11.5–15.5)
WBC: 16.7 10*3/uL — AB (ref 4.0–10.5)

## 2018-05-06 MED ORDER — OXYCODONE HCL 5 MG PO TABS: 5.0000 mg | ORAL_TABLET | Freq: Four times a day (QID) | ORAL | 0 refills | Status: DC | PRN

## 2018-05-06 MED ORDER — ACETAMINOPHEN 325 MG PO TABS: 650.0000 mg | ORAL_TABLET | Freq: Four times a day (QID) | ORAL | Status: DC | PRN

## 2018-05-06 NOTE — Discharge Summary (Signed)
[facility] Discharge Summary   Patient ID: Cheryl Haynes MRN: [id number] DOB/AGE: 41-10-1976 41 y.o.  Admit date: 05/04/2018 Discharge date: 05/06/2018  Admitting Diagnosis: Acute cholecystitis  Discharge Diagnosis Acute cholecystitis - s/p laparoscopic cholecystectomy  Consultants None  Imaging: Dg Cholangiogram Operative  Result Date: 05/05/2018 CLINICAL DATA:  Intraoperative cholangiogram during laparoscopic cholecystectomy. EXAM: INTRAOPERATIVE CHOLANGIOGRAM FLUOROSCOPY TIME:  21 seconds COMPARISON:  Right upper quadrant abdominal ultrasound-05/04/2018 FINDINGS: Intraoperative cholangiographic images of the right upper abdominal quadrant during laparoscopic cholecystectomy are provided for review. Surgical clips overlie the expected location of the gallbladder fossa. Contrast injection demonstrates selective cannulation of the central aspect of the cystic duct. 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 There is passage of contrast through the central aspect of the cystic duct with filling of a non dilated common bile duct. There is passage of contrast though the CBD and into the descending portion of the duodenum. There is minimal reflux of injected contrast into the common hepatic duct and central aspect of the non dilated intrahepatic biliary system. Transient nonocclusive filling defect within the nondependent portion of the confluence of the biliary hilum is favored to represent an air bubble. There are no discrete persistent filling defects within the opacified portions of the biliary system to suggest the presence of choledocholithiasis. IMPRESSION: 1. No evidence of choledocholithiasis. 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 Electronically Signed   By: Simonne ComeJohn  Watts M.D.   On: 05/05/2018 10:17   [place] Abdomen  Limited Ruq  Result Date: 05/04/2018 CLINICAL DATA:  Right upper quadrant abdominal pain. EXAM: ULTRASOUND ABDOMEN LIMITED RIGHT UPPER QUADRANT COMPARISON:  None. FINDINGS: Gallbladder: Multiple dependent gallstones, largest measuring 2.5 cm. There are echogenic foci within the wall with ring down artifact consistent with cholesterolosis. Gallbladder wall is mildly thickened measuring 3.7 mm. No pericholecystic fluid. Patient is tender to transducer pressure over the gallbladder. Common bile duct: Diameter: 5 mm Liver: Heterogeneous increased parenchymal echogenicity. No mass or focal lesion. Portal vein is patent on color Doppler imaging with normal direction of blood flow towards the liver. IMPRESSION: 1. Gallstones with mild gallbladder wall thickening and a positive sonographic Murphy's sign. Findings support acute cholecystitis in the proper clinical setting. 2. Hepatic steatosis. Electronically Signed   By: Amie Portlandavid  Ormond M.D.   On: 05/04/2018 15:53    Procedures Dr. Corliss Skainssuei (05/05/18) - [facility]  [facility] Course:  Patient is a 42 year old hispanic female [facility].  Workup showed acute cholecycstitis.  Patient was admitted and underwent procedure listed above.  Tolerated procedure well and was transferred to the floor.  Diet was advanced as tolerated.  On POD#1, the patient was voiding well, tolerating diet, ambulating well, pain well controlled, vital signs stable, incisions c/d/i and felt stable for discharge home.  Patient will follow up in our office in 2 weeks and knows to call with questions or concerns. She will call to confirm appointment date/time.    Physical Exam: General:  Alert, NAD, pleasant, comfortable Abd:  Soft, ND, mild tenderness, incisions C/D/I  Allergies as of 05/06/2018   No Known Allergies     Medication List    TAKE these medications   acetaminophen 325 MG tablet Commonly known as:  TYLENOL Take 2 tablets (650  mg total) by mouth every 6 (six) hours as needed for mild  pain (or Fever >/= 101).   norelgestromin-ethinyl estradiol 150-35 MCG/24HR transdermal patch Commonly known as:  ORTHO EVRA Place 1 patch onto the skin once a week.   norethindrone 0.35 MG tablet Commonly known as:  MICRONOR,CAMILA,ERRIN Take 1 tablet (0.35 mg total) by mouth daily.   oxyCODONE 5 MG immediate release tablet Commonly known as:  Oxy IR/ROXICODONE Take 1 tablet (5 mg total) by mouth every 6 (six) hours as needed for moderate pain.        Follow-up Information    [facility], [facility]. Go on 05/23/2018.   Specialty:  General Surgery Why:  Follow up appointment scheduled for 9:00 AM. Please arrive 30 min prior to appointment time. Bring photo ID and insurance information.  Contact information: [address] [place] [address] [phone]           Signed: Wells Guiles, [facility] 05/06/2018, 8:17 AM Pager: [phone] Consults: [phone] Mon-Fri 7:00 am-4:30 pm Sat-Sun 7:00 am-11:30 am

## 2018-06-22 IMAGING — US US MFM OB FOLLOW-UP
1 series · 12 of 28 positions shown · non-contrast
Comparison: none

[Series 1: us mfm ob follow-up · 47 acquisitions, 12 frames shown]
[im 2/47]
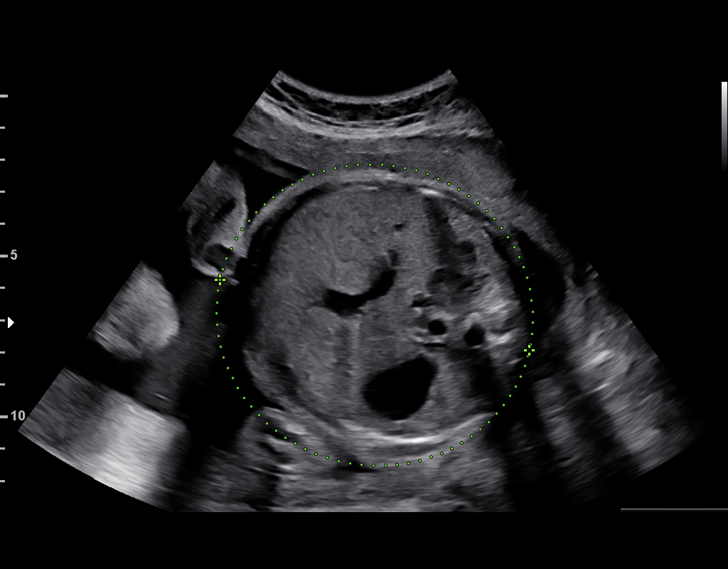
[im 6/47]
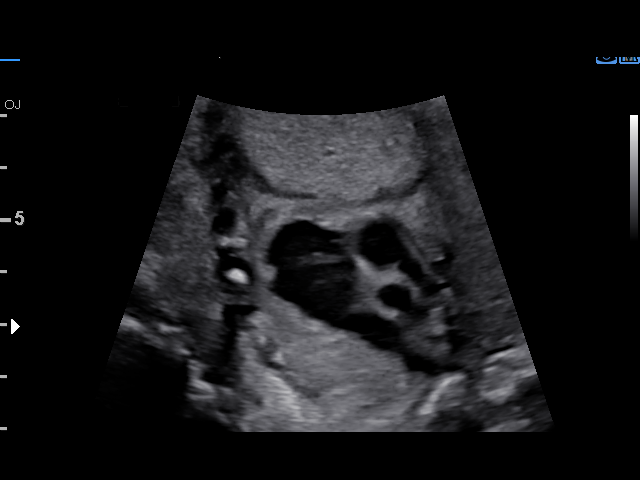
[im 9/47]
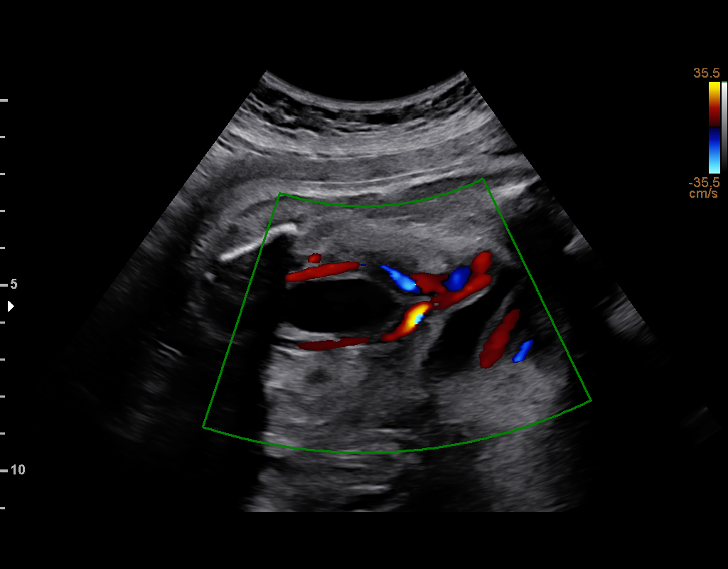
[im 14/47]
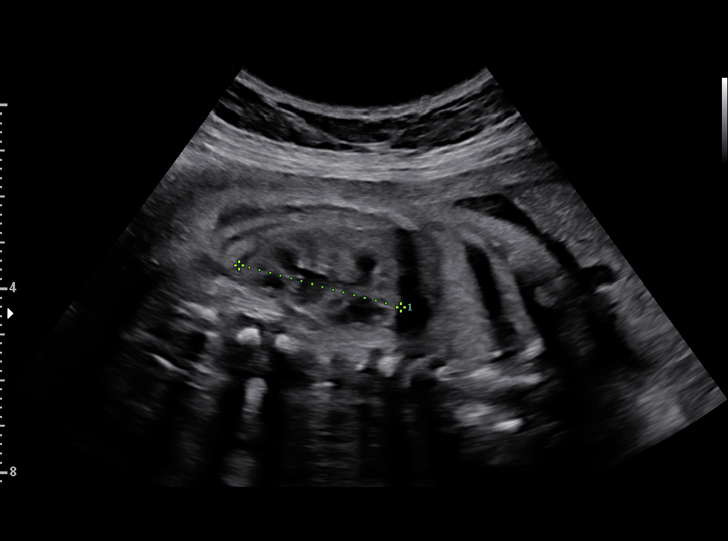
[im 18/47]
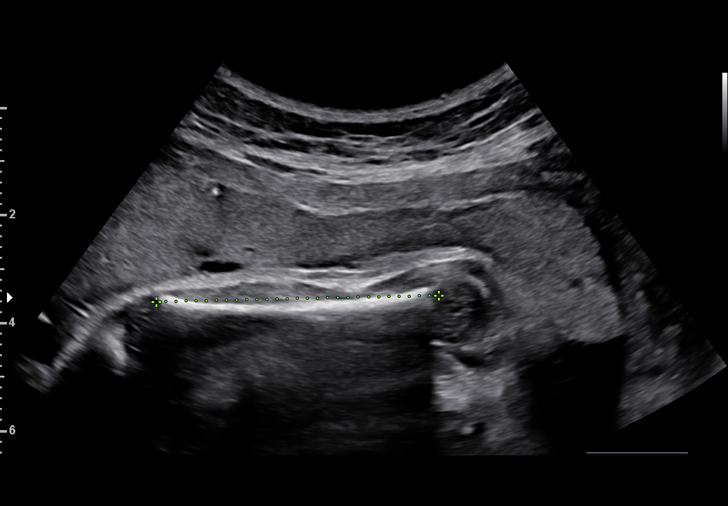
[im 21/47]
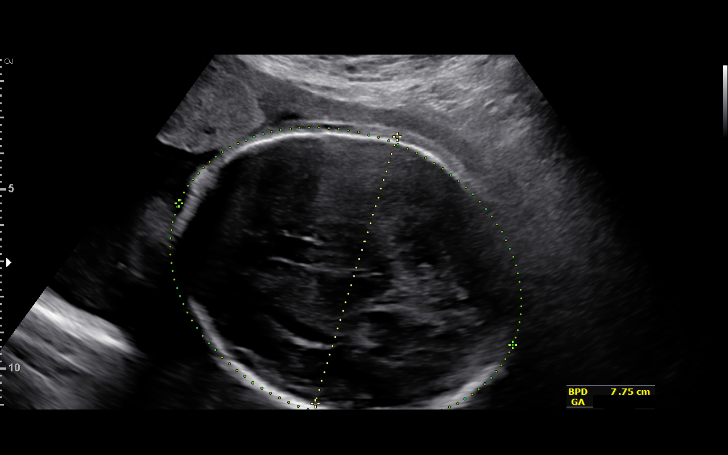
[im 26/47]
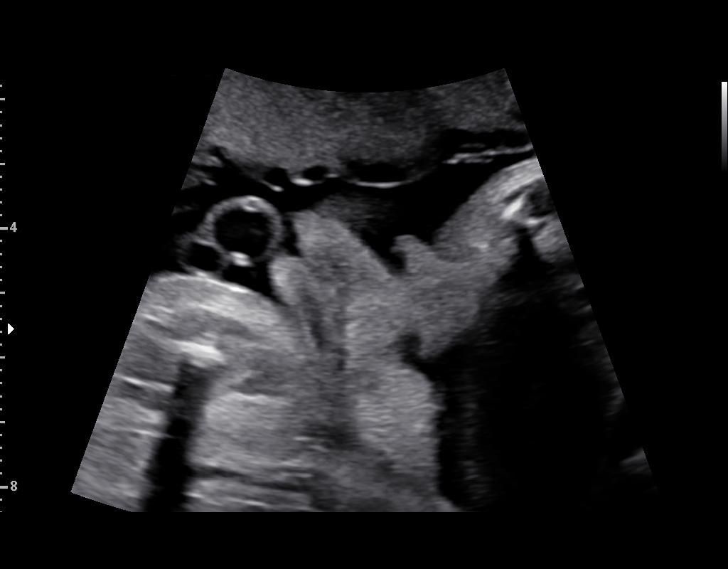
[im 29/47]
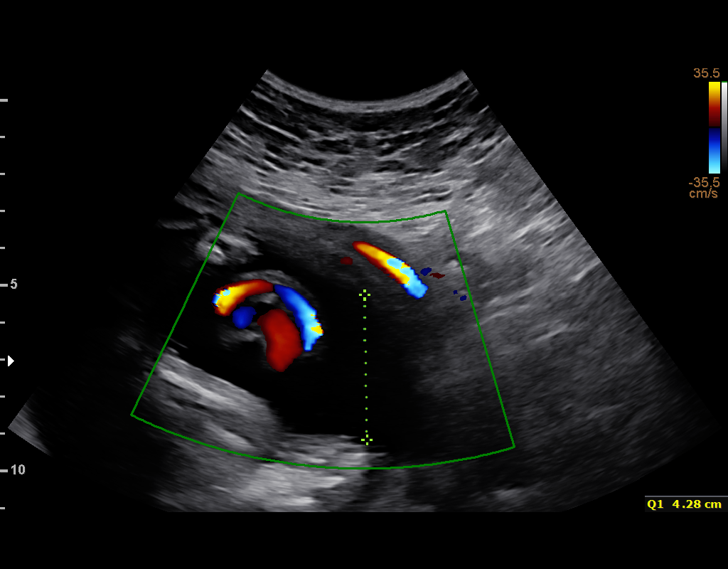
[im 33/47]
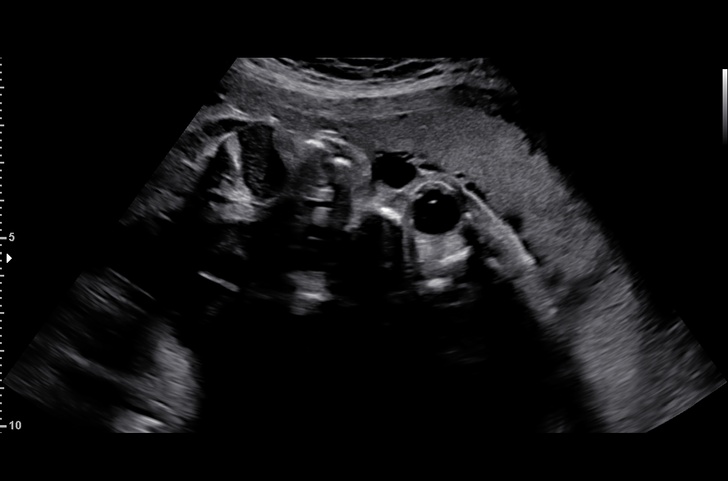
[im 38/47]
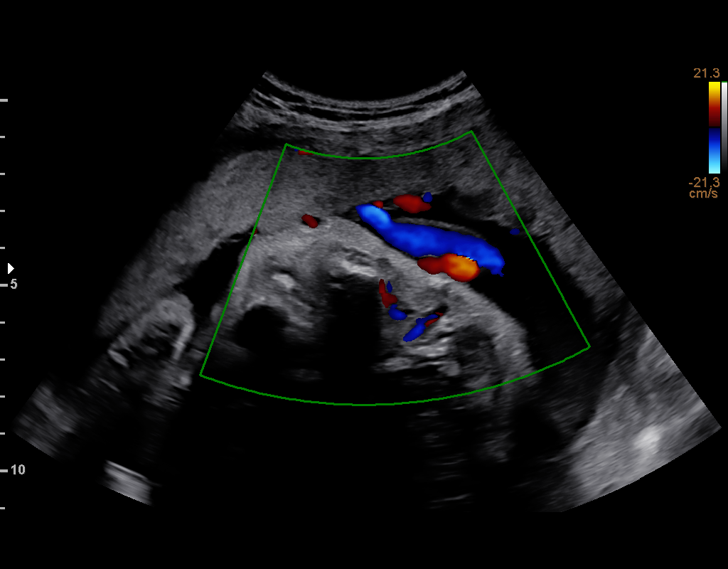
[im 41/47]
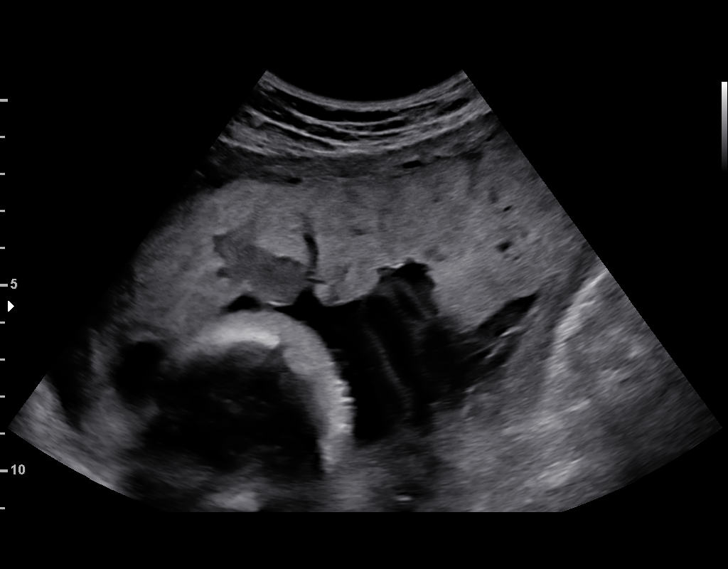
[im 45/47]
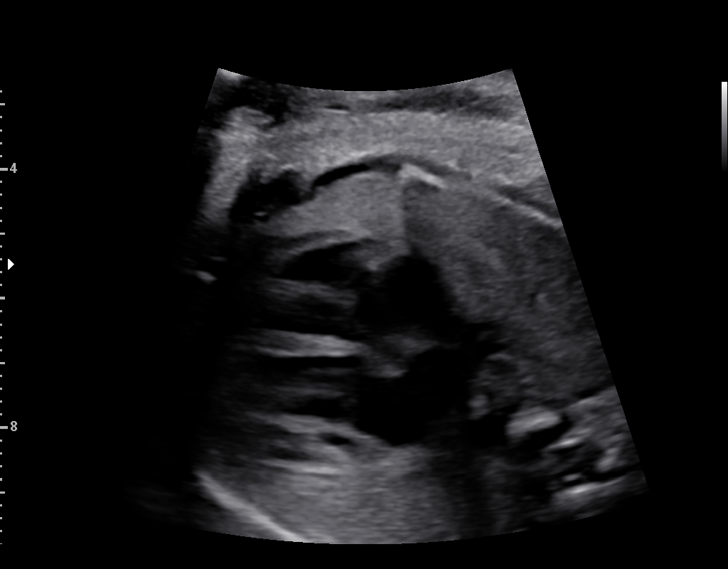

[12 of 28 positions shown; findings below may reference images not displayed]

[facility]

OB/Gyn Clinic
ORBELINA NP

1  CHRISTILL ANDRIANO           [id number]      [id number]     [id number]
Indications

30 weeks gestation of pregnancy
Pre-existing diabetes, type 2, in pregnancy,
third trimester -normal fetal echo-glyburide
OB History

Gravidity:    5         Term:   2        Prem:   1        SAB:   1
TOP:          0       Ectopic:  0        Living: 3
Fetal Evaluation

Num Of Fetuses:     1
Fetal Heart         147
Rate(bpm):
Cardiac Activity:   Observed
Presentation:       Cephalic
Placenta:           Fundal, above cervical os
P. Cord Insertion:  Visualized

Amniotic Fluid
AFI FV:      Subjectively within normal limits
AFI Sum(cm)     %Tile       Largest Pocket(cm)
14.33           49

RUQ(cm)       RLQ(cm)       LUQ(cm)        LLQ(cm)
4.28
Biometry

BPD:      77.2  mm     G. Age:  31w 0d         69  %    CI:        75.05   %    70 - 86
FL/HC:      20.4   %    19.2 -
HC:      282.7  mm     G. Age:  31w 0d         44  %    HC/AC:      0.93        0.99 -
AC:      302.4  mm     G. Age:  34w 2d       > 97  %    FL/BPD:     74.7   %    71 - 87
FL:       57.7  mm     G. Age:  30w 1d         41  %    FL/AC:      19.1   %    20 - 24
HUM:      52.3  mm     G. Age:  30w 3d         62  %
CER:      38.5  mm     G. Age:  32w 5d         89  %

Est. FW:    5223  gm      4 lb 6 oz     85  %
Gestational Age

LMP:           30w 0d        Date:  01/28/16                 EDD:   11/03/16
U/S Today:     31w 4d                                        EDD:   10/23/16
Best:          30w 0d     Det. By:  LMP  (01/28/16)          EDD:   11/03/16
Anatomy

Cranium:               Appears normal         Aortic Arch:            Previously seen
Cavum:                 Appears normal         Ductal Arch:            Previously seen
Ventricles:            Appears normal         Diaphragm:              Appears normal
Choroid Plexus:        Previously seen        Stomach:                Appears normal, left
sided
Cerebellum:            Previously seen        Abdomen:                Appears normal
Posterior Fossa:       Previously seen        Abdominal Wall:         Previously seen
Nuchal Fold:           Not applicable (>20    Cord Vessels:           Appears normal (3
wks GA)                                        vessel cord)
Face:                  Orbits and profile     Kidneys:                Appear normal
previously seen
Lips:                  Appears normal         Bladder:                Appears normal
Thoracic:              Appears normal         Spine:                  Previously seen
Heart:                 Appears normal         Upper Extremities:      Previously seen
(4CH, axis, and situs
RVOT:                  Appears normal         Lower Extremities:      Previously seen
LVOT:                  Previously seen

Other:  Female gender previously seen. Heels, 5th digit, and Nasal bone
previously seen.
Cervix Uterus Adnexa

Cervix
Not visualized (advanced GA >17wks)

Uterus
No abnormality visualized.

Left Ovary
Not visualized. No adnexal mass visualized.

Right Ovary
Not visualized. No adnexal mass visualized.
Impression

Singleton intrauterine pregnancy at 30 weeks. Following for
type 2 DM, on glyburide
Review of the anatomy shows no sonographic markers for
aneuploidy or structural anomalies
Amniotic fluid volume is normal with an AFI of 14.3cm
Estimated fetal weight is 1975g which is growth in the 85th
percentile
Recommendations

Recommend follow-up ultrasound examination in 4 weeks as
scheduled
Recommend starting antenatal fetal testing at 32 weeks.

## 2021-12-12 ENCOUNTER — Emergency Department (HOSPITAL_COMMUNITY)
Admission: EM | Admit: 2021-12-12 | Discharge: 2021-12-12 | Disposition: A | Discharge status: Home / Self Care | Payer: Self-pay | Attending: Emergency Medicine | Admitting: Emergency Medicine

## 2021-12-12 ENCOUNTER — Other Ambulatory Visit: Payer: Self-pay

## 2021-12-12 ENCOUNTER — Emergency Department (HOSPITAL_COMMUNITY): Payer: Self-pay

## 2021-12-12 ENCOUNTER — Encounter (HOSPITAL_COMMUNITY): Payer: Self-pay | Admitting: *Deleted

## 2021-12-12 DIAGNOSIS — J209 Acute bronchitis, unspecified: Secondary | ICD-10-CM | POA: Insufficient documentation

## 2021-12-12 DIAGNOSIS — Z20822 Contact with and (suspected) exposure to covid-19: Secondary | ICD-10-CM | POA: Insufficient documentation

## 2021-12-12 LAB — RESP PANEL BY RT-PCR (FLU A&B, COVID) ARPGX2
Influenza A by PCR: NEGATIVE
Influenza B by PCR: NEGATIVE
SARS Coronavirus 2 by RT PCR: NEGATIVE

## 2021-12-12 MED ORDER — ALBUTEROL SULFATE HFA 108 (90 BASE) MCG/ACT IN AERS
2.0000 | INHALATION_SPRAY | Freq: Once | RESPIRATORY_TRACT | Status: AC
  Administered 2021-12-12: 2 via RESPIRATORY_TRACT
  Filled 2021-12-12: qty 6.7

## 2021-12-12 NOTE — ED Provider Notes (Signed)
[facility] DEPT Provider Note   CSN: [id number] Arrival date & time: 12/12/21  1115     History  Chief Complaint  Patient presents with   flu like    Cough    Cheryl Haynes is a 46 y.o. female.  Cheryl Haynes is a 46 y.o. female who presents to the ED today with approximately four weeks of cough that has been persistent but gradually improving. When her symptoms began, she had congestion and productive cough. She denies any fever or chills, nausea, vomiting, diarrhea, or shortness of breath throughout the illness. She used ginger tea and cough syrup with minimal relief of symptoms. She now has a persistent dry cough, chest tightness with cold air, and mild congestion. She is not vaccinated for covid19 or flu. Her last c19 infection was two years ago and did not require oxygen or hospitalization. Today she denies chest pain, shortness of breath, fever/chills, n/v, and diarrhea.  The history is provided by the patient and the spouse. The history is limited by a language barrier. A language interpreter was used.      Home Medications Prior to Admission medications   Medication Sig Start Date End Date Taking? Authorizing Provider  acetaminophen (TYLENOL) 325 MG tablet Take 2 tablets (650 mg total) by mouth every 6 (six) hours as needed for mild pain (or Fever >/= 101). 05/06/18   Norm Parcel, PA-C  norelgestromin-ethinyl estradiol (ORTHO EVRA) 150-35 MCG/24HR transdermal patch Place 1 patch onto the skin once a week. Patient not taking: Reported on 05/04/2018 12/02/16   Tamala Julian, [place], CNM  norethindrone (MICRONOR,CAMILA,ERRIN) 0.35 MG tablet Take 1 tablet (0.35 mg total) by mouth daily. Patient not taking: Reported on 05/04/2018 12/02/16   Tamala Julian, [place], CNM  oxyCODONE (OXY IR/ROXICODONE) 5 MG immediate release tablet Take 1 tablet (5 mg total) by mouth every 6 (six) hours as needed for moderate pain. 05/06/18   Norm Parcel, PA-C       Allergies    Patient has no known allergies.    Review of Systems   Review of Systems  Constitutional:  Negative for chills and fever.  HENT:  Positive for congestion and rhinorrhea. Negative for ear pain and sore throat.   Eyes:  Negative for pain and visual disturbance.  Respiratory:  Positive for cough. Negative for shortness of breath.   Cardiovascular:  Negative for chest pain and palpitations.  Gastrointestinal:  Negative for abdominal pain and vomiting.  Genitourinary:  Negative for dysuria and hematuria.  Musculoskeletal:  Negative for arthralgias and back pain.  Skin:  Negative for color change and rash.  Neurological:  Negative for seizures and syncope.  All other systems reviewed and are negative.  Physical Exam Updated Vital Signs BP (!) 170/94 (BP Location: Left Arm)    Pulse 97    Temp 98 F (36.7 C) (Oral)    Resp 18    Ht 5' (1.524 m)    LMP 11/23/2021    SpO2 100%    BMI 29.19 kg/m  Physical Exam Vitals and nursing note reviewed.  Constitutional:      General: She is not in acute distress.    Appearance: She is well-developed. She is not ill-appearing or diaphoretic.  HENT:     Head: Normocephalic and atraumatic.     Nose: Congestion and rhinorrhea present.     Mouth/Throat:     Mouth: Mucous membranes are moist.     Pharynx: Oropharynx is clear.  Eyes:  General:        Right eye: No discharge.        Left eye: No discharge.  Neck:     Comments: No rigidity Cardiovascular:     Rate and Rhythm: Normal rate and regular rhythm.     Heart sounds: Normal heart sounds. No murmur heard.   No friction rub. No gallop.  Pulmonary:     Effort: Pulmonary effort is normal. No respiratory distress.     Breath sounds: Normal breath sounds.     Comments: Respirations equal and unlabored, patient able to speak in full sentences, lungs clear to auscultation bilaterally  Abdominal:     General: Bowel sounds are normal. There is no distension.     Palpations:  Abdomen is soft. There is no mass.     Tenderness: There is no abdominal tenderness. There is no guarding.     Comments: Abdomen soft, nondistended, nontender to palpation in all quadrants without guarding or peritoneal signs  Musculoskeletal:        General: No deformity.     Cervical back: Neck supple.  Lymphadenopathy:     Cervical: No cervical adenopathy.  Skin:    General: Skin is warm and dry.     Capillary Refill: Capillary refill takes less than 2 seconds.  Neurological:     Mental Status: She is alert and oriented to person, place, and time.  Psychiatric:        Mood and Affect: Mood normal.        Behavior: Behavior normal.    ED Results / Procedures / Treatments   Labs (all labs ordered are listed, but only abnormal results are displayed) Labs Reviewed  RESP PANEL BY RT-PCR (FLU A&B, COVID) [id number]    EKG None  Radiology DG Chest 2 View  Result Date: 12/12/2021 CLINICAL DATA:  46 year old female with cough since the end of December. Shortness of breath when coughing. EXAM: CHEST - 2 VIEW COMPARISON:  None. FINDINGS: Somewhat low lung volumes, especially on the lateral view. Normal cardiac size and mediastinal contours. Visualized tracheal air column is within normal limits. No pneumothorax, pulmonary edema, pleural effusion or confluent pulmonary opacity. Lung markings within normal limits. Mild levoconvex thoracic scoliosis. No acute osseous abnormality identified. Negative visible bowel gas. IMPRESSION: Low lung volumes.  No acute cardiopulmonary abnormality. Electronically Signed   By: Genevie Ann M.D.   On: 12/12/2021 11:56    Procedures Procedures    Medications Ordered in ED Medications  albuterol (VENTOLIN HFA) 108 (90 Base) MCG/ACT inhaler 2 puff (has no administration in time range)    ED Course/ Medical Decision Making/ A&P                           46 year old female presents with 4 weeks of cough, initially started with cough and congestion was productive  but now has cleared up and patient just has persistent cough that is worse when she goes out into cold air.  No associated fevers, chest pain or shortness of breath.  On arrival patient is well-appearing, vitals are normal aside from hypertension.  Differential includes bronchitis, viral upper respiratory infection, COVID, flu, pneumonia.  Labs: Negative COVID and flu testing.  Considered blood work but do not feel it would significantly change management at this time  Imaging: I have personally reviewed and interpreted chest x-ray which shows no active cardiopulmonary disease, agree with radiologist interpretation  Medications: Patient given albuterol for spasms  of cough and chest tightness with coughing, and symptoms have improved.  At this time feel patient is appropriate for discharge home with continued outpatient supportive treatment.  Discussed treatment plan the discharge instructions with Spanish interpreter and all questions answered.  Patient does not currently have insurance and does not have a PCP, provided resources for community health and wellness clinic.  Discussed appropriate return precautions and continued supportive treatment.  Discharged home in good condition.        Final Clinical Impression(s) / ED Diagnoses Final diagnoses:  Acute bronchitis, unspecified organism    Rx / DC Orders ED Discharge Orders     None         Jacqlyn Larsen, [place] 12/12/21 1354    Valarie Merino, MD 12/12/21 1416

## 2021-12-12 NOTE — ED Triage Notes (Signed)
Pt reports flu like symptoms with cough since end of December. Secretions are clean, cough cause chest to hurt and she feels shob when coughing

## 2021-12-12 NOTE — Discharge Instructions (Addendum)
Su prueba de COVID y gripe es negativa hoy y la radiografa de trax se ve bien. Sospecha que sus sntomas se deben a una bronquitis reactiva. Puede usar Flonase y Zyrtec de venta libre para ayudar con cualquier congestin nasal. Puede usar medicamentos para la tos de venta libre como Mucinex DM segn sea necesario para la tos y Chief Technology Officer de albuterol proporcionado 2 inhalaciones cada 4 a 6 horas segn sea necesario para la tos persistente. Esto puede ser especialmente til cuando tiene ataques de tos en [place].    Your COVID and flu test are negative today and chest x-ray looks good.  Suspect her symptoms are due to reactive bronchitis.  You can use over-the-counter Flonase and Zyrtec to help with any nasal congestion.  You can use over-the-counter cough medication like Mucinex DM as needed for cough and you can use provided albuterol inhaler 2 puffs every 4-6 hours as needed for persistent coughing.  This can be especially helpful when you are having coughing fits in the middle.  You can follow-up with the community health as wellness clinic.  Follow-up for new or worsening symptoms.

## 2022-08-18 ENCOUNTER — Ambulatory Visit: Payer: Self-pay | Admitting: *Deleted

## 2022-08-18 NOTE — Telephone Encounter (Signed)
  Chief Complaint: eye irritation-bilateral Symptoms: eye irritation, discharge/mucus,redness Frequency: Started Friday Pertinent Negatives: Patient denies fever Disposition: [] ED /[x] Urgent Care (no appt availability in office) / [] Appointment(In office/virtual)/ []  [facility]/ [] Home Care/ [] Refused Recommended Disposition /[]  Mobile Bus/ []  Follow-up with PCP Additional Notes: no PCP- advised UC

## 2022-08-18 NOTE — Telephone Encounter (Signed)
Summary: eye irritation   The patient has experienced eye discomfort and irritation for roughly 4 days   The patient shares that their vision is blurry and obscured   Please contact the patient further when possible     Interpreter: [phone]  Reason for Disposition  MODERATE eye pain (e.g., interferes with normal activities)  Answer Assessment - Initial Assessment Questions 1. ONSET: "When did the pain start?" (e.g., minutes, hours, days)     Friday- eyes itching,redness,mucus 2. TIMING: "Does the pain come and go, or has it been constant since it started?" (e.g., constant, intermittent, fleeting)     constant 3. SEVERITY: "How bad is the pain?"   (Scale 1-10; mild, moderate or severe)   - MILD (1-3): doesn't interfere with normal activities    - MODERATE (4-7): interferes with normal activities or awakens from sleep    - SEVERE (8-10): excruciating pain and patient unable to do normal activities     *No Answer* 4. LOCATION: "Where does it hurt?"  (e.g., eyelid, eye, cheekbone)     Both eyes 5. CAUSE: "What do you think is causing the pain?"     *No Answer* 6. VISION: "Do you have blurred vision or changes in your vision?"      *No Answer* 7. EYE DISCHARGE: "Is there any discharge (pus) from the eye(s)?"  If Yes, ask: "What color is it?"      *No Answer* 8. FEVER: "Do you have a fever?" If Yes, ask: "What is it, how was it measured, and when did it start?"      *No Answer* 9. OTHER SYMPTOMS: "Do you have any other symptoms?" (e.g., headache, nasal discharge, facial rash)     *No Answer* 10. PREGNANCY: "Is there any chance you are pregnant?" "When was your last menstrual period?"       *No Answer*  Answer Assessment - Initial Assessment Questions 1. EYE DISCHARGE: "Is the discharge in one or both eyes?" "What color is it?" "How much is there?" "When did the discharge start?"      Watery-both,wakes with mucus on eyes- Friday 2. REDNESS OF SCLERA: "Is the redness in one or  both eyes?" "When did the redness start?"      Both-eye and lid are red 3. EYELIDS: "Are the eyelids red or swollen?" If Yes, ask: "How much?"      Red, Sunday they were swollen and painful 4. VISION: "Is there any difficulty seeing clearly?"      Mucus and discharge make vision blurry 5. PAIN: "Is there any pain? If Yes, ask: "How bad is it?" (Scale 1-10; or mild, moderate, severe)    - MILD (1-3): doesn't interfere with normal activities     - MODERATE (4-7): interferes with normal activities or awakens from sleep    - SEVERE (8-10): excruciating pain, unable to do any normal activities       No pain today 6. CONTACT LENS: "Do you wear contacts?"     no 7. OTHER SYMPTOMS: "Do you have any other symptoms?" (e.g., fever, runny nose, cough)     congestion 8. PREGNANCY: "Is there any chance you are pregnant?" "When was your last menstrual period?"     *No Answer*  Protocols used: Eye Pain and Other Symptoms-A-AH, Eye - Pus or [place]

## 2022-09-07 ENCOUNTER — Ambulatory Visit (HOSPITAL_COMMUNITY)
Admission: EM | Admit: 2022-09-07 | Discharge: 2022-09-07 | Disposition: A | Discharge status: Home / Self Care | Payer: Self-pay | Attending: Family Medicine | Admitting: Family Medicine

## 2022-09-07 ENCOUNTER — Encounter (HOSPITAL_COMMUNITY): Payer: Self-pay | Admitting: *Deleted

## 2022-09-07 DIAGNOSIS — R1013 Epigastric pain: Secondary | ICD-10-CM

## 2022-09-07 DIAGNOSIS — R112 Nausea with vomiting, unspecified: Secondary | ICD-10-CM

## 2022-09-07 LAB — COMPREHENSIVE METABOLIC PANEL
ALT: 48 U/L — ABNORMAL HIGH (ref 0–44)
AST: 34 U/L (ref 15–41)
Albumin: 3.9 g/dL (ref 3.5–5.0)
Alkaline Phosphatase: 60 U/L (ref 38–126)
Anion gap: 14 (ref 5–15)
BUN: 12 mg/dL (ref 6–20)
CO2: 21 mmol/L — ABNORMAL LOW (ref 22–32)
Calcium: 8.5 mg/dL — ABNORMAL LOW (ref 8.9–10.3)
Chloride: 102 mmol/L (ref 98–111)
Creatinine, Ser: 0.45 mg/dL (ref 0.44–1.00)
GFR, Estimated: >60 mL/min (ref >60)
Glucose, Bld: 103 mg/dL — ABNORMAL HIGH (ref 70–99)
Potassium: 3.2 mmol/L — ABNORMAL LOW (ref 3.5–5.1)
Sodium: 137 mmol/L (ref 135–145)
Total Bilirubin: 1 mg/dL (ref 0.3–1.2)
Total Protein: 7.9 g/dL (ref 6.5–8.1)

## 2022-09-07 LAB — POCT URINALYSIS DIPSTICK, ED / UC
Bilirubin Urine: NEGATIVE
Glucose, UA: NEGATIVE mg/dL
Leukocytes,Ua: NEGATIVE
Nitrite: NEGATIVE
Protein, ur: 30 mg/dL — AB
Specific Gravity, Urine: 1.015 (ref 1.005–1.030)
Urobilinogen, UA: 1 mg/dL (ref 0.0–1.0)
pH: 5.5 (ref 5.0–8.0)

## 2022-09-07 LAB — CBC WITH DIFFERENTIAL/PLATELET
Abs Immature Granulocytes: 0.04 10*3/uL (ref 0.00–0.07)
Basophils Absolute: 0 10*3/uL (ref 0.0–0.1)
Basophils Relative: 0 %
Eosinophils Absolute: 0 10*3/uL (ref 0.0–0.5)
Eosinophils Relative: 0 %
HCT: 40.3 % (ref 36.0–46.0)
Hemoglobin: 13.3 g/dL (ref 12.0–15.0)
Immature Granulocytes: 0 %
Lymphocytes Relative: 6 %
Lymphs Abs: 0.7 10*3/uL (ref 0.7–4.0)
MCH: 30.2 pg (ref 26.0–34.0)
MCHC: 33 g/dL (ref 30.0–36.0)
MCV: 91.6 fL (ref 80.0–100.0)
Monocytes Absolute: 0.4 10*3/uL (ref 0.1–1.0)
Monocytes Relative: 3 %
Neutro Abs: 10.5 10*3/uL — ABNORMAL HIGH (ref 1.7–7.7)
Neutrophils Relative %: 91 %
Platelets: 220 10*3/uL (ref 150–400)
RBC: 4.4 MIL/uL (ref 3.87–5.11)
RDW: 12.8 % (ref 11.5–15.5)
WBC: 11.7 10*3/uL — ABNORMAL HIGH (ref 4.0–10.5)
nRBC: 0 % (ref 0.0–0.2)

## 2022-09-07 LAB — POC URINE PREG, ED: Preg Test, Ur: NEGATIVE

## 2022-09-07 LAB — LIPASE, BLOOD: Lipase: 41 U/L (ref 11–51)

## 2022-09-07 MED ORDER — ONDANSETRON 4 MG PO TBDP
4.0000 mg | ORAL_TABLET | Freq: Once | ORAL | Status: AC
  Administered 2022-09-07: 4 mg via ORAL

## 2022-09-07 MED ORDER — ONDANSETRON 4 MG PO TBDP
ORAL_TABLET | ORAL | Status: AC
  Filled 2022-09-07: qty 1

## 2022-09-07 MED ORDER — ONDANSETRON 4 MG PO TBDP: 4.0000 mg | ORAL_TABLET | Freq: Three times a day (TID) | ORAL | 0 refills | Status: AC | PRN

## 2022-09-07 NOTE — Discharge Instructions (Addendum)
You have been seen today for abdominal pain. Your evaluation was not suggestive of any emergent condition requiring medical intervention at this time. However, some abdominal problems make take more time to appear. Therefore, it is very important for you to pay attention to any new symptoms or worsening of your current condition.  Please return here or to the Emergency Department immediately should you begin to feel worse in any way or have any of the following symptoms: increasing or different abdominal pain, persistent vomiting, inability to drink fluids, fevers, or shaking chills.   You have had labs (blood work) drawn today. We will call you with any significant abnormalities or if there is need to begin or change treatment or pursue further follow up.  You may also review your test results online through MyChart. If you do not have a MyChart account, instructions to sign up should be on your discharge paperwork.  

## 2022-09-07 NOTE — ED Triage Notes (Signed)
Pt states that she has abdominal pain and vomiting all day. No meds today.

## 2022-09-08 ENCOUNTER — Telehealth (HOSPITAL_COMMUNITY): Payer: Self-pay | Admitting: Emergency Medicine

## 2022-09-08 NOTE — Telephone Encounter (Signed)
Per Dr. Mannie Stabile "Recommend recheck CBC/CMP in next 48 hours for slightly elevated WBC and slightly low K. Seen for abd pain. If any worsening, recommend ED eval."  Reviewed with patient, she states she will return tomorrow, but pain is improving.

## 2022-09-08 NOTE — ED Provider Notes (Signed)
Cheryl Haynes   [id number] 09/07/22 Arrival Time: 3825  ASSESSMENT & PLAN:  1. Nausea and vomiting, unspecified vomiting type   2. Abdominal pain, epigastric    Overall benign abdominal exam. No indications for urgent abdominal/pelvic imaging at this time. Discussed. CBC/CMP/lipase pending. She and her husband are comfortable with home observation. Agree to ED evaluation should her symptoms worsen in any way.  U/A without signs of infection. Trace Hg. UPT negative.  Meds ordered this encounter  Medications   ondansetron (ZOFRAN-ODT) disintegrating tablet 4 mg   ondansetron (ZOFRAN-ODT) 4 MG disintegrating tablet    Sig: Take 1 tablet (4 mg total) by mouth every 8 (eight) hours as needed for nausea or vomiting.    Dispense:  15 tablet    Refill:  0     Discharge Instructions      You have been seen today for abdominal pain. Your evaluation was not suggestive of any emergent condition requiring medical intervention at this time. However, some abdominal problems make take more time to appear. Therefore, it is very important for you to pay attention to any new symptoms or worsening of your current condition.  Please return here or to the Emergency Department immediately should you begin to feel worse in any way or have any of the following symptoms: increasing or different abdominal pain, persistent vomiting, inability to drink fluids, fevers, or shaking chills.   You have had labs (blood work) drawn today. We will call you with any significant abnormalities or if there is need to begin or change treatment or pursue further follow up.  You may also review your test results online through [place]. If you do not have a MyChart account, instructions to sign up should be on your discharge paperwork.     Follow-up Information     [facility].   Specialty: Emergency Medicine Why: If worsening or failing to improve as  anticipated. Contact information: [address] [id number] [place] [phone]                 Reviewed expectations re: course of current medical issues. Questions answered. Outlined signs and symptoms indicating need for more acute intervention. Patient verbalized understanding. After Visit Summary given.   SUBJECTIVE: History from: patient. Interpreter declined. Cheryl Haynes is a 46 y.o. female who presents with complaint of non-radiating epigastric abd pain; onset this morning; gradual worsening but stable over past few hours. Pain preceded by vomiting, non-bilious and non-bloody. No diarrhea. Afebrile. Without back pain. Normal ambulation. Overall decreased appetite and PO intake. No sick contacts. No recent travel. No urinary symptoms. History of similar: no. No tx PTA.  Patient's last menstrual period was 08/23/2022 (exact date).  Past Surgical History:  Procedure Laterality Date   CHOLECYSTECTOMY N/A 05/05/2018   Procedure: LAPAROSCOPIC CHOLECYSTECTOMY WITH INTRAOPERATIVE CHOLANGIOGRAM;  Surgeon: Donnie Mesa, MD;  Location: [facility];  Service: General;  Laterality: N/A;   NO PAST SURGERIES     UMBILICAL HERNIA REPAIR N/A 05/05/2018   Procedure: HERNIA REPAIR UMBILICAL ADULT;  Surgeon: Donnie Mesa, MD;  Location: [facility];  Service: General;  Laterality: N/A;     OBJECTIVE:  Vitals:   09/07/22 1838  BP: (!) 145/80  Pulse: (!) 110  Resp: 18  Temp: 98.4 F (36.9 C)  TempSrc: Oral  SpO2: 98%    Slight tachycardia noted. 102 on repeat.  General appearance: alert, oriented, no acute distress HEENT: [place]; AT; oropharynx moist Lungs: unlabored respirations  Abdomen: soft; without distention; mild  and poorly localized tenderness to palpation over epigastric region ; normal bowel sounds; without masses or organomegaly; without guarding or rebound tenderness Back: without reported CVA tenderness; FROM at  waist Extremities: without LE edema; symmetrical; without gross deformities Skin: warm and dry Neurologic: normal gait Psychological: alert and cooperative; normal mood and affect   No Known Allergies                                             Past Medical History:  Diagnosis Date   Depression    postpartum after 1st delivery   Gestational diabetes mellitus (GDM), antepartum 05/31/2016   Early diagnosis 2017. Normal Postpartum 2 hour GTT 2018.    Varicose vein of leg    left leg    Social History   Socioeconomic History   Marital status: Married    Spouse name: Not on file   Number of children: Not on file   Years of education: Not on file   Highest education level: Not on file  Occupational History   Not on file  Tobacco Use   Smoking status: Never   Smokeless tobacco: Never  Vaping Use   Vaping Use: Never used  Substance and Sexual Activity   Alcohol use: No    Alcohol/week: 0.0 standard drinks of alcohol   Drug use: No   Sexual activity: Yes    Birth control/protection: None  Other Topics Concern   Not on file  Social History Narrative   Not on file   Social Determinants of Health   Financial Resource Strain: Not on file  Food Insecurity: Not on file  Transportation Needs: Not on file  Physical Activity: Not on file  Stress: Not on file  Social Connections: Not on file  Intimate Partner Violence: Not on file    Family History  Problem Relation Age of Onset   Diabetes Father    Hypertension Father      Mardella Layman, MD 09/08/22 1332

## 2022-09-09 ENCOUNTER — Telehealth (HOSPITAL_COMMUNITY): Payer: Self-pay | Admitting: Family Medicine

## 2022-09-09 ENCOUNTER — Ambulatory Visit (HOSPITAL_COMMUNITY)
Admission: EM | Admit: 2022-09-09 | Discharge: 2022-09-09 | Disposition: A | Discharge status: Home / Self Care | Payer: Self-pay | Attending: Internal Medicine | Admitting: Internal Medicine

## 2022-09-09 DIAGNOSIS — Z0189 Encounter for other specified special examinations: Secondary | ICD-10-CM

## 2022-09-09 DIAGNOSIS — R109 Unspecified abdominal pain: Secondary | ICD-10-CM | POA: Insufficient documentation

## 2022-09-09 DIAGNOSIS — D72829 Elevated white blood cell count, unspecified: Secondary | ICD-10-CM

## 2022-09-09 DIAGNOSIS — R7989 Other specified abnormal findings of blood chemistry: Secondary | ICD-10-CM | POA: Insufficient documentation

## 2022-09-09 LAB — CBC WITH DIFFERENTIAL/PLATELET
Abs Immature Granulocytes: 0.03 10*3/uL (ref 0.00–0.07)
Basophils Absolute: 0 10*3/uL (ref 0.0–0.1)
Basophils Relative: 0 %
Eosinophils Absolute: 0.1 10*3/uL (ref 0.0–0.5)
Eosinophils Relative: 1 %
HCT: 37.5 % (ref 36.0–46.0)
Hemoglobin: 12.6 g/dL (ref 12.0–15.0)
Immature Granulocytes: 1 %
Lymphocytes Relative: 20 %
Lymphs Abs: 1.2 10*3/uL (ref 0.7–4.0)
MCH: 30.8 pg (ref 26.0–34.0)
MCHC: 33.6 g/dL (ref 30.0–36.0)
MCV: 91.7 fL (ref 80.0–100.0)
Monocytes Absolute: 0.6 10*3/uL (ref 0.1–1.0)
Monocytes Relative: 10 %
Neutro Abs: 4.3 10*3/uL (ref 1.7–7.7)
Neutrophils Relative %: 68 %
Platelets: 209 10*3/uL (ref 150–400)
RBC: 4.09 MIL/uL (ref 3.87–5.11)
RDW: 13 % (ref 11.5–15.5)
WBC: 6.3 10*3/uL (ref 4.0–10.5)
nRBC: 0 % (ref 0.0–0.2)

## 2022-09-09 LAB — COMPREHENSIVE METABOLIC PANEL
ALT: 49 U/L — ABNORMAL HIGH (ref 0–44)
AST: 29 U/L (ref 15–41)
Albumin: 3.6 g/dL (ref 3.5–5.0)
Alkaline Phosphatase: 53 U/L (ref 38–126)
Anion gap: 8 (ref 5–15)
BUN: 8 mg/dL (ref 6–20)
CO2: 25 mmol/L (ref 22–32)
Calcium: 8.8 mg/dL — ABNORMAL LOW (ref 8.9–10.3)
Chloride: 107 mmol/L (ref 98–111)
Creatinine, Ser: 0.52 mg/dL (ref 0.44–1.00)
GFR, Estimated: >60 mL/min (ref >60)
Glucose, Bld: 113 mg/dL — ABNORMAL HIGH (ref 70–99)
Potassium: 3.3 mmol/L — ABNORMAL LOW (ref 3.5–5.1)
Sodium: 140 mmol/L (ref 135–145)
Total Bilirubin: 0.6 mg/dL (ref 0.3–1.2)
Total Protein: 7.4 g/dL (ref 6.5–8.1)

## 2022-09-09 NOTE — ED Notes (Signed)
Pt here today for repeat labs per Dr Mannie Stabile.

## 2022-09-09 NOTE — Telephone Encounter (Signed)
Orders placed for cbc and cmp; recommended in notes yesterday.

## 2022-12-30 ENCOUNTER — Ambulatory Visit (HOSPITAL_COMMUNITY)
Admission: EM | Admit: 2022-12-30 | Discharge: 2022-12-30 | Disposition: A | Discharge status: Home / Self Care | Payer: Self-pay | Attending: Emergency Medicine | Admitting: Emergency Medicine

## 2022-12-30 ENCOUNTER — Encounter (HOSPITAL_COMMUNITY): Payer: Self-pay | Admitting: Emergency Medicine

## 2022-12-30 DIAGNOSIS — Z77098 Contact with and (suspected) exposure to other hazardous, chiefly nonmedicinal, chemicals: Secondary | ICD-10-CM

## 2022-12-30 MED ORDER — TETRACAINE HCL 0.5 % OP SOLN
1.0000 [drp] | Freq: Once | OPHTHALMIC | Status: AC
  Administered 2022-12-30: 2 [drp] via OPHTHALMIC

## 2022-12-30 MED ORDER — FLUORESCEIN SODIUM 1 MG OP STRP
1.0000 | ORAL_STRIP | Freq: Once | OPHTHALMIC | Status: AC
  Administered 2022-12-30: 1 via OPHTHALMIC

## 2022-12-30 NOTE — Discharge Instructions (Signed)
Systane lubricating eyedrops as often as you want, cool compresses.  Follow-up with Dr. Posey Pronto tomorrow if you are still having symptoms.  Go to the ER if things get worse or you start having visual changes, headaches, fevers.  Below is a list of primary care practices who are taking new patients for you to follow-up with.  [facility] -multiple locations.  See website at [email or web address]  [facility] internal medicine clinic Ground Floor - [facility], [place], [place], [place] [address] [phone]  [facility] at [facility] [address] [place], [place] [address] [phone]  [facility]- will see patients with no insurance [address] [place], [place] [address] [place], [place] [address] [phone]  [facility] [phone] [place] [address]  [facility]: [place]  [phone]  [facility]: [address] [place]  [phone]  [facility] : [address] [place] [phone]  [facility]: [address] [place] [phone]  [facility]: [place] [phone]  Dr. Blanchie Serve [address] [place]  [facility]- will see patients with no insurance. [address] [place], [place] [address] [phone]  Go to www.goodrx.com  or www.costplusdrugs.com to look up your medications. This will give you a list of where you can find your prescriptions at the most affordable prices. Or ask the pharmacist what the cash price is, or if they have any other discount programs available to help make your medication more affordable. This can be  less expensive than what you would pay with insurance.

## 2022-12-30 NOTE — ED Provider Notes (Signed)
HPI  SUBJECTIVE:  Cheryl Haynes is a 47 y.o. female who presents with mild, burning right eye pain after accidentally splashing some no spill Clorox into her right eye.  This occurred 4 hours prior to evaluation.  She states the bottle fell and splashed her.  Denies accidental ingestion.  She irrigated her eye out immediately for 15 minutes with improvement of symptoms.  No aggravating factors.  No photophobia, pain with extraocular movements, visual changes.  She does not wear contacts or glasses.  She has no past medical history.  PCP: None.  Ophthalmology: None.  Patient and family member declined Web designer.  Past Medical History:  Diagnosis Date   Depression    postpartum after 1st delivery   Gestational diabetes mellitus (GDM), antepartum 05/31/2016   Early diagnosis 2017. Normal Postpartum 2 hour GTT 2018.    Varicose vein of leg    left leg    Past Surgical History:  Procedure Laterality Date   CHOLECYSTECTOMY N/A 05/05/2018   Procedure: LAPAROSCOPIC CHOLECYSTECTOMY WITH INTRAOPERATIVE CHOLANGIOGRAM;  Surgeon: Donnie Mesa, MD;  Location: [facility];  Service: General;  Laterality: N/A;   NO PAST SURGERIES     UMBILICAL HERNIA REPAIR N/A 05/05/2018   Procedure: HERNIA REPAIR UMBILICAL ADULT;  Surgeon: Donnie Mesa, MD;  Location: [facility];  Service: General;  Laterality: N/A;    Family History  Problem Relation Age of Onset   Diabetes Father    Hypertension Father     Social History   Tobacco Use   Smoking status: Never   Smokeless tobacco: Never  Vaping Use   Vaping Use: Never used  Substance Use Topics   Alcohol use: No    Alcohol/week: 0.0 standard drinks of alcohol   Drug use: No    No current facility-administered medications for this encounter.  Current Outpatient Medications:    ondansetron (ZOFRAN-ODT) 4 MG disintegrating tablet, Take 1 tablet (4 mg total) by mouth every 8 (eight) hours as needed for nausea or vomiting., Disp:  15 tablet, Rfl: 0  No Known Allergies   ROS  As noted in HPI.   Physical Exam  BP (!) 145/92 (BP Location: Left Arm)   Pulse 85   Temp 98.2 F (36.8 C) (Oral)   Resp 17   LMP 12/23/2022   SpO2 100%   Constitutional: Well developed, well nourished, no acute distress Eyes: Initial pH 7.  EOMI, PERRLA, no direct or consensual photophobia.  Conjunctiva normal bilaterally.  No corneal abrasion seen on fluorescein exam.   Visual Acuity  Right Eye Distance: 20/25 Left Eye Distance: 20/20 Bilateral Distance: 20/20  Right Eye Near:   Left Eye Near:    Bilateral Near:      HENT: Normocephalic, atraumatic,mucus membranes moist Respiratory: Normal inspiratory effort Cardiovascular: Normal rate GI: nondistended skin: No rash, skin intact Musculoskeletal: no deformities Neurologic: Alert & oriented x 3, no focal neuro deficits Psychiatric: Speech and behavior appropriate   ED Course   Medications  tetracaine (PONTOCAINE) 0.5 % ophthalmic solution 1-2 drop (2 drops Right Eye Given 12/30/22 1521)  fluorescein ophthalmic strip 1 strip (1 strip Right Eye Given by Other 12/30/22 1521)    Orders Placed This Encounter  Procedures   Visual acuity screening    Standing Status:   Standing    Number of Occurrences:   1    No results found for this or any previous visit (from the past 24 hour(s)). No results found.  ED Clinical Impression  1. Chemical exposure  of eye      ED Assessment/Plan     Patient status post exposure to bleach in her right eye.  Fortunately, there does not appear to be any damage.  Visual acuity is nearly equal, and her pH is normal.  Advised Systane, cool compresses, follow-up with Dr. Posey Pronto, ophthalmology on-call if still having symptoms tomorrow.  Will provide primary care list for routine care.  Discussed MDM, treatment plan, and plan for follow-up with patient.  patient agrees with plan.   Meds ordered this encounter  Medications   tetracaine  (PONTOCAINE) 0.5 % ophthalmic solution 1-2 drop   fluorescein ophthalmic strip 1 strip      *This clinic note was created using [employer]. Therefore, there may be occasional mistakes despite careful proofreading.  ?    Melynda Ripple, MD 12/30/22 1544

## 2022-12-30 NOTE — ED Triage Notes (Signed)
Pt got bleach in right eye about 3 hours ago. Did flush eye out. Denies visual impairment. Reports burns a little
